# Patient Record
Sex: Female | Born: 1985 | Race: Black or African American | Hispanic: No | Marital: Single | Smoking: Current every day smoker
Health system: Southern US, Community
[De-identification: ages and names within clinical notes are randomized; demographics above are authoritative.]

## PROBLEM LIST (undated history)

## (undated) DIAGNOSIS — K649 Unspecified hemorrhoids: Secondary | ICD-10-CM

## (undated) DIAGNOSIS — I10 Essential (primary) hypertension: Secondary | ICD-10-CM

## (undated) DIAGNOSIS — D649 Anemia, unspecified: Secondary | ICD-10-CM

## (undated) HISTORY — DX: Unspecified hemorrhoids: K64.9

## (undated) HISTORY — DX: Anemia, unspecified: D64.9

---
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
## 2007-04-27 ENCOUNTER — Ambulatory Visit: Payer: Self-pay | Admitting: Family Medicine

## 2007-09-15 ENCOUNTER — Observation Stay: Payer: Self-pay

## 2007-09-20 ENCOUNTER — Observation Stay: Payer: Self-pay | Admitting: Obstetrics and Gynecology

## 2007-09-21 ENCOUNTER — Observation Stay: Payer: Self-pay | Admitting: Obstetrics and Gynecology

## 2007-09-29 ENCOUNTER — Observation Stay: Payer: Self-pay | Admitting: Obstetrics and Gynecology

## 2007-10-05 ENCOUNTER — Inpatient Hospital Stay: Payer: Self-pay | Admitting: Obstetrics and Gynecology

## 2008-05-09 ENCOUNTER — Observation Stay: Payer: Self-pay | Admitting: Certified Nurse Midwife

## 2008-06-22 ENCOUNTER — Observation Stay: Payer: Self-pay | Admitting: Unknown Physician Specialty

## 2008-09-24 ENCOUNTER — Ambulatory Visit: Payer: Self-pay

## 2008-09-24 ENCOUNTER — Inpatient Hospital Stay: Payer: Self-pay

## 2010-04-24 LAB — HM HIV SCREENING LAB: HM HIV Screening: NEGATIVE

## 2010-10-15 ENCOUNTER — Emergency Department: Payer: Self-pay | Admitting: Emergency Medicine

## 2011-10-27 ENCOUNTER — Emergency Department: Payer: Self-pay | Admitting: Emergency Medicine

## 2011-10-27 LAB — COMPREHENSIVE METABOLIC PANEL
Albumin: 4.5 g/dL (ref 3.4–5.0)
Alkaline Phosphatase: 73 U/L (ref 50–136)
Anion Gap: 6 — ABNORMAL LOW (ref 7–16)
Chloride: 105 mmol/L (ref 98–107)
Co2: 26 mmol/L (ref 21–32)
Creatinine: 0.77 mg/dL (ref 0.60–1.30)
EGFR (Non-African Amer.): >60
Glucose: 82 mg/dL (ref 65–99)
Osmolality: 274 (ref 275–301)
SGPT (ALT): 22 U/L
Total Protein: 9.5 g/dL — ABNORMAL HIGH (ref 6.4–8.2)

## 2011-10-27 LAB — CBC
MCHC: 32.2 g/dL (ref 32.0–36.0)
Platelet: 249 10*3/uL (ref 150–440)
RDW: 15.6 % — ABNORMAL HIGH (ref 11.5–14.5)

## 2011-10-27 LAB — URINALYSIS, COMPLETE
Bilirubin,UR: NEGATIVE
Glucose,UR: NEGATIVE mg/dL (ref 0–75)
Nitrite: POSITIVE
Ph: 5 (ref 4.5–8.0)
Squamous Epithelial: 7
WBC UR: 34 /HPF (ref 0–5)

## 2013-09-11 ENCOUNTER — Emergency Department: Payer: Self-pay | Admitting: Emergency Medicine

## 2014-01-30 ENCOUNTER — Emergency Department: Payer: Self-pay | Admitting: Emergency Medicine

## 2014-01-30 LAB — URINALYSIS, COMPLETE
BILIRUBIN, UR: NEGATIVE
Glucose,UR: NEGATIVE mg/dL (ref 0–75)
Ketone: NEGATIVE
Nitrite: NEGATIVE
Ph: 6 (ref 4.5–8.0)
SPECIFIC GRAVITY: 1.026 (ref 1.003–1.030)
Squamous Epithelial: 6
WBC UR: 67 /HPF (ref 0–5)

## 2014-04-05 ENCOUNTER — Emergency Department: Payer: Self-pay | Admitting: Emergency Medicine

## 2014-04-05 LAB — URINALYSIS, COMPLETE
BILIRUBIN, UR: NEGATIVE
Blood: NEGATIVE
GLUCOSE, UR: NEGATIVE mg/dL (ref 0–75)
Nitrite: POSITIVE
Ph: 5 (ref 4.5–8.0)
Protein: 30
RBC,UR: NONE SEEN /HPF (ref 0–5)
SPECIFIC GRAVITY: 1.023 (ref 1.003–1.030)
Squamous Epithelial: NONE SEEN
WBC UR: 110 /HPF (ref 0–5)

## 2014-07-24 ENCOUNTER — Emergency Department: Payer: Self-pay | Admitting: Emergency Medicine

## 2014-11-17 ENCOUNTER — Emergency Department: Payer: Self-pay | Admitting: Emergency Medicine

## 2015-05-17 ENCOUNTER — Encounter: Payer: Self-pay | Admitting: Emergency Medicine

## 2015-05-17 ENCOUNTER — Emergency Department
Admission: EM | Admit: 2015-05-17 | Discharge: 2015-05-17 | Disposition: A | Discharge status: Home / Self Care | Payer: Medicaid Other | Attending: Emergency Medicine | Admitting: Emergency Medicine

## 2015-05-17 DIAGNOSIS — J020 Streptococcal pharyngitis: Secondary | ICD-10-CM | POA: Diagnosis not present

## 2015-05-17 DIAGNOSIS — R5081 Fever presenting with conditions classified elsewhere: Secondary | ICD-10-CM | POA: Diagnosis not present

## 2015-05-17 DIAGNOSIS — R509 Fever, unspecified: Secondary | ICD-10-CM

## 2015-05-17 DIAGNOSIS — R000 Tachycardia, unspecified: Secondary | ICD-10-CM | POA: Diagnosis not present

## 2015-05-17 DIAGNOSIS — R52 Pain, unspecified: Secondary | ICD-10-CM | POA: Diagnosis present

## 2015-05-17 MED ORDER — ACETAMINOPHEN 500 MG PO TABS
ORAL_TABLET | ORAL | Status: AC
  Administered 2015-05-17: 1000 mg via ORAL
  Filled 2015-05-17: qty 2

## 2015-05-17 MED ORDER — ACETAMINOPHEN 500 MG PO TABS
1000.0000 mg | ORAL_TABLET | Freq: Once | ORAL | Status: AC
  Administered 2015-05-17: 1000 mg via ORAL

## 2015-05-17 MED ORDER — PENICILLIN G BENZATHINE 1200000 UNIT/2ML IM SUSP
1.2000 10*6.[IU] | Freq: Once | INTRAMUSCULAR | Status: AC
  Administered 2015-05-17: 1.2 10*6.[IU] via INTRAMUSCULAR
  Filled 2015-05-17: qty 2

## 2015-05-17 MED ORDER — DEXAMETHASONE SODIUM PHOSPHATE 10 MG/ML IJ SOLN
10.0000 mg | Freq: Once | INTRAMUSCULAR | Status: AC
  Administered 2015-05-17: 10 mg via INTRAMUSCULAR
  Filled 2015-05-17: qty 1

## 2015-05-17 MED ORDER — IBUPROFEN 800 MG PO TABS: 800.0000 mg | ORAL_TABLET | Freq: Three times a day (TID) | ORAL | Status: DC | PRN

## 2015-05-17 NOTE — Discharge Instructions (Signed)
Fever, Adult A fever is a higher than normal body temperature. In an adult, an oral temperature around 98.6 F (37 C) is considered normal. A temperature of 100.4 F (38 C) or higher is generally considered a fever. Mild or moderate fevers generally have no long-term effects and often do not require treatment. Extreme fever (greater than or equal to 106 F or 41.1 C) can cause seizures. The sweating that may occur with repeated or prolonged fever may cause dehydration. Elderly people can develop confusion during a fever. A measured temperature can vary with:  Age.  Time of day.  Method of measurement (mouth, underarm, rectal, or ear). The fever is confirmed by taking a temperature with a thermometer. Temperatures can be taken different ways. Some methods are accurate and some are not.  An oral temperature is used most commonly. Electronic thermometers are fast and accurate.  An ear temperature will only be accurate if the thermometer is positioned as recommended by the manufacturer.  A rectal temperature is accurate and done for those adults who have a condition where an oral temperature cannot be taken.  An underarm (axillary) temperature is not accurate and not recommended. Fever is a symptom, not a disease.  CAUSES   Infections commonly cause fever.  Some noninfectious causes for fever include:  Some arthritis conditions.  Some thyroid or adrenal gland conditions.  Some immune system conditions.  Some types of cancer.  A medicine reaction.  High doses of certain street drugs such as methamphetamine.  Dehydration.  Exposure to high outside or room temperatures.  Occasionally, the source of a fever cannot be determined. This is sometimes called a "fever of unknown origin" (FUO).  Some situations may lead to a temporary rise in body temperature that may go away on its own. Examples are:  Childbirth.  Surgery.  Intense exercise. HOME CARE INSTRUCTIONS   Take  appropriate medicines for fever. Follow dosing instructions carefully. If you use acetaminophen to reduce the fever, be careful to avoid taking other medicines that also contain acetaminophen. Do not take aspirin for a fever if you are younger than age 75. There is an association with Reye's syndrome. Reye's syndrome is a rare but potentially deadly disease.  If an infection is present and antibiotics have been prescribed, take them as directed. Finish them even if you start to feel better.  Rest as needed.  Maintain an adequate fluid intake. To prevent dehydration during an illness with prolonged or recurrent fever, you may need to drink extra fluid.Drink enough fluids to keep your urine clear or pale yellow.  Sponging or bathing with room temperature water may help reduce body temperature. Do not use ice water or alcohol sponge baths.  Dress comfortably, but do not over-bundle. SEEK MEDICAL CARE IF:   You are unable to keep fluids down.  You develop vomiting or diarrhea.  You are not feeling at least partly better after 3 days.  You develop new symptoms or problems. SEEK IMMEDIATE MEDICAL CARE IF:   You have shortness of breath or trouble breathing.  You develop excessive weakness.  You are dizzy or you faint.  You are extremely thirsty or you are making little or no urine.  You develop new pain that was not there before (such as in the head, neck, chest, back, or abdomen).  You have persistent vomiting and diarrhea for more than 1 to 2 days.  You develop a stiff neck or your eyes become sensitive to light.  You develop a  skin rash.  You have a fever or persistent symptoms for more than 2 to 3 days.  You have a fever and your symptoms suddenly get worse. MAKE SURE YOU:   Understand these instructions.  Will watch your condition.  Will get help right away if you are not doing well or get worse. Document Released: 02/10/2001 Document Revised: 01/01/2014 Document  Reviewed: 06/18/2011 [facility] Patient Information 2015 [place], [place]. This information is not intended to replace advice given to you by your health care provider. Make sure you discuss any questions you have with your health care provider. Strep Throat Strep throat is an infection of the throat caused by a bacteria named Streptococcus pyogenes. Your health care provider may call the infection streptococcal "tonsillitis" or "pharyngitis" depending on whether there are signs of inflammation in the tonsils or back of the throat. Strep throat is most common in children aged 5-15 years during the cold months of the year, but it can occur in people of any age during any season. This infection is spread from person to person (contagious) through coughing, sneezing, or other close contact. SIGNS AND SYMPTOMS   Fever or chills.  Painful, swollen, red tonsils or throat.  Pain or difficulty when swallowing.  White or yellow spots on the tonsils or throat.  Swollen, tender lymph nodes or "glands" of the neck or under the jaw.  Red rash all over the body (rare). DIAGNOSIS  Many different infections can cause the same symptoms. A test must be done to confirm the diagnosis so the right treatment can be given. A "rapid strep test" can help your health care provider make the diagnosis in a few minutes. If this test is not available, a light swab of the infected area can be used for a throat culture test. If a throat culture test is done, results are usually available in a day or two. TREATMENT  Strep throat is treated with antibiotic medicine. HOME CARE INSTRUCTIONS   Gargle with 1 tsp of salt in 1 cup of warm water, 3-4 times per day or as needed for comfort.  Family members who also have a sore throat or fever should be tested for strep throat and treated with antibiotics if they have the strep infection.  Make sure everyone in your household washes their hands well.  Do not share food, drinking  cups, or personal items that could cause the infection to spread to others.  You may need to eat a soft food diet until your sore throat gets better.  Drink enough water and fluids to keep your urine clear or pale yellow. This will help prevent dehydration.  Get plenty of rest.  Stay home from school, day care, or work until you have been on antibiotics for 24 hours.  Take medicines only as directed by your health care provider.  Take your antibiotic medicine as directed by your health care provider. Finish it even if you start to feel better. SEEK MEDICAL CARE IF:   The glands in your neck continue to enlarge.  You develop a rash, cough, or earache.  You cough up green, yellow-brown, or bloody sputum.  You have pain or discomfort not controlled by medicines.  Your problems seem to be getting worse rather than better.  You have a fever. SEEK IMMEDIATE MEDICAL CARE IF:   You develop any new symptoms such as vomiting, severe headache, stiff or painful neck, chest pain, shortness of breath, or trouble swallowing.  You develop severe throat pain, drooling,  or changes in your voice.  You develop swelling of the neck, or the skin on the neck becomes red and tender.  You develop signs of dehydration, such as fatigue, dry mouth, and decreased urination.  You become increasingly sleepy, or you cannot wake up completely. MAKE SURE YOU:  Understand these instructions.  Will watch your condition.  Will get help right away if you are not doing well or get worse. Document Released: 08/14/2000 Document Revised: 01/01/2014 Document Reviewed: 10/16/2010 [facility] Patient Information 2015 [place], [place]. This information is not intended to replace advice given to you by your health care provider. Make sure you discuss any questions you have with your health care provider.

## 2015-05-17 NOTE — ED Notes (Signed)
Pt states she has body aches, sore throat, pt states she hasnt been able to get out of bed due to being so weak

## 2015-05-17 NOTE — ED Provider Notes (Signed)
CSN: [id number]     Arrival date & time 05/17/15  1751 History   First MD Initiated Contact with Patient 05/17/15 1941     Chief Complaint  Patient presents with  . Generalized Body Aches     (Consider location/radiation/quality/duration/timing/severity/associated sxs/prior Treatment) HPI  Andrea Huang is a 29 y.o. female presenting with a 3 day history of sore throat and myalgias. Patient states that she has 10/10 pain described as a constant soreness throughout her entire body, but worse in her legs. She states it is painful to swallow and that she has not eaten anything for 3 days and has limited fluid intake as well. Sore throat is unrelieved by cough drops. Patient has tried no OTC pain relievers or fever reducers at home.Patient has not checked temperature at home, but has felt hot, cold, and sweaty.  She denies nasal congestion, cough, SOB, chest pain, or LOC.   She denies recent sick contacts or recent international travel. Patient has history of multiple strep throat infections and states that these symptoms seem similar to when she has had strep throat in the past.   History reviewed. No pertinent past medical history. History reviewed. No pertinent past surgical history. Family History  Problem Relation Age of Onset  . Family history unknown: Yes   Social History  Substance Use Topics  . Smoking status: Never Smoker   . Smokeless tobacco: Never Used  . Alcohol Use: No   OB History    No data available     Review of Systems  Constitutional: Positive for chills, diaphoresis, appetite change (decreased) and fatigue.  HENT: Positive for ear pain (left side with headache). Negative for congestion, drooling, rhinorrhea, sinus pressure and sneezing.   Eyes: Positive for visual disturbance (blurry vision when dizzy).  Respiratory: Negative for cough, chest tightness and shortness of breath.   Cardiovascular: Negative for chest pain and leg swelling.  Gastrointestinal:  Positive for nausea. Negative for vomiting, abdominal pain, diarrhea, constipation and blood in stool.  Genitourinary: Positive for decreased urine volume. Negative for dysuria, frequency, hematuria, vaginal bleeding and difficulty urinating.  Musculoskeletal: Positive for myalgias. Negative for neck stiffness.  Skin: Negative for rash.  Neurological: Positive for headaches. Negative for syncope.      Allergies  Review of patient's allergies indicates no known allergies.  Home Medications   Prior to Admission medications   Medication Sig Start Date End Date Taking? Authorizing Provider  ibuprofen (ADVIL,MOTRIN) 800 MG tablet Take 1 tablet (800 mg total) by mouth every 8 (eight) hours as needed. 05/17/15   Evon Slack, PA-C   BP 119/82 mmHg  Pulse 102  Temp(Src) 99 F (37.2 C) (Oral)  Resp 18  Ht  (1.575 m)  Wt 115 lb (52.164 kg)  BMI 21.03 kg/m2  SpO2 99%  LMP 05/07/2015 Physical Exam  Constitutional: She is oriented to person, place, and time. She appears well-developed. She appears lethargic. She appears distressed.  HENT:  Head: Normocephalic and atraumatic.  Right Ear: External ear normal.  Left Ear: External ear normal.  Mouth/Throat: Uvula is midline. Oropharyngeal exudate and posterior oropharyngeal erythema present. No tonsillar abscesses.  No uvula shift  Eyes: Conjunctivae and EOM are normal.  Neck: Normal range of motion. Neck supple.  Cardiovascular: Regular rhythm.  Tachycardia present.  Exam reveals no gallop and no friction rub.   No murmur heard. Pulmonary/Chest: Effort normal and breath sounds normal. No stridor. She has no wheezes. She has no rales.  Abdominal: Soft.  Bowel sounds are normal. She exhibits no distension and no mass. There is tenderness (mildly tender throughout abdomen). There is no rebound and no guarding.  Musculoskeletal: Normal range of motion. She exhibits tenderness (upper thighs tender with movement). She exhibits no edema.   Lymphadenopathy:    She has cervical adenopathy.  Neurological: She is oriented to person, place, and time. She appears lethargic. No cranial nerve deficit or sensory deficit.  Skin: Skin is warm and dry. No rash noted. She is not diaphoretic.    ED Course  Procedures (including critical care time) Labs Review Labs Reviewed - No data to display  Imaging Review No results found. I have personally reviewed and evaluated these images and lab results as part of my medical decision-making.   EKG Interpretation None     Rapid strep is positive. Temperature is 102.6 F and pulse is 133 on arrival. Tylenol 1,000 mg po given. Temperature on recheck 102.6F and pulse 122. At time of discharge temperature 90.9, pulse 102, respirations 18, O2 99%. Tolerating by mouth well  Encourage po fluid intake with water.    Decadron 10 mg IM and Penicillin G 1.2 million units IM given.  MDM   Final diagnoses:  Strep pharyngitis  Other specified fever   29 year old female with strep throat. Treated with penicillin G 1.2 million units IM, dexamethasone 10 mg IM. Ibuprofen, Tylenol as needed for fevers. Increase fluids. Follow 2-3 days if no improvement.    Evon Slack, PA-C 05/17/15 2143  Minna Antis, MD 05/17/15 [phone]

## 2015-05-21 LAB — POCT RAPID STREP A: STREPTOCOCCUS, GROUP A SCREEN (DIRECT): POSITIVE — AB

## 2015-05-23 ENCOUNTER — Encounter: Payer: Self-pay | Admitting: *Deleted

## 2015-05-23 ENCOUNTER — Emergency Department
Admission: EM | Admit: 2015-05-23 | Discharge: 2015-05-23 | Disposition: A | Discharge status: Home / Self Care | Payer: Medicaid Other | Attending: Emergency Medicine | Admitting: Emergency Medicine

## 2015-05-23 DIAGNOSIS — J020 Streptococcal pharyngitis: Secondary | ICD-10-CM | POA: Insufficient documentation

## 2015-05-23 DIAGNOSIS — J029 Acute pharyngitis, unspecified: Secondary | ICD-10-CM | POA: Diagnosis present

## 2015-05-23 LAB — CBC WITH DIFFERENTIAL/PLATELET
BASOS ABS: 0 10*3/uL (ref 0–0.1)
Eosinophils Absolute: 0.1 10*3/uL (ref 0–0.7)
Eosinophils Relative: 1 %
HEMATOCRIT: 32.7 % — AB (ref 35.0–47.0)
HEMOGLOBIN: 10.3 g/dL — AB (ref 12.0–16.0)
Lymphs Abs: 1 10*3/uL (ref 1.0–3.6)
MCH: 23 pg — ABNORMAL LOW (ref 26.0–34.0)
MCHC: 31.5 g/dL — ABNORMAL LOW (ref 32.0–36.0)
MCV: 73 fL — AB (ref 80.0–100.0)
Monocytes Absolute: 1.6 10*3/uL — ABNORMAL HIGH (ref 0.2–0.9)
Monocytes Relative: 26 %
NEUTROS ABS: 3.4 10*3/uL (ref 1.4–6.5)
Platelets: 227 10*3/uL (ref 150–440)
RBC: 4.48 MIL/uL (ref 3.80–5.20)
RDW: 15.7 % — ABNORMAL HIGH (ref 11.5–14.5)
WBC: 6.1 10*3/uL (ref 3.6–11.0)

## 2015-05-23 LAB — BASIC METABOLIC PANEL
ANION GAP: 10 (ref 5–15)
BUN: 9 mg/dL (ref 6–20)
CALCIUM: 9.4 mg/dL (ref 8.9–10.3)
CO2: 27 mmol/L (ref 22–32)
Chloride: 95 mmol/L — ABNORMAL LOW (ref 101–111)
Creatinine, Ser: 0.59 mg/dL (ref 0.44–1.00)
GLUCOSE: 97 mg/dL (ref 65–99)
POTASSIUM: 3.9 mmol/L (ref 3.5–5.1)
Sodium: 132 mmol/L — ABNORMAL LOW (ref 135–145)

## 2015-05-23 LAB — POCT RAPID STREP A: STREPTOCOCCUS, GROUP A SCREEN (DIRECT): NEGATIVE

## 2015-05-23 LAB — MONONUCLEOSIS SCREEN: Mono Screen: NEGATIVE

## 2015-05-23 MED ORDER — MAGIC MOUTHWASH: 5.0000 mL | Freq: Four times a day (QID) | ORAL | Status: DC

## 2015-05-23 MED ORDER — AZITHROMYCIN 250 MG PO TABS: ORAL_TABLET | ORAL | Status: DC

## 2015-05-23 NOTE — ED Notes (Signed)
States she was seen last week for strep  Now having body aches

## 2015-05-23 NOTE — ED Provider Notes (Signed)
[facility] Emergency Department Provider Note  ____________________________________________  Time seen: Approximately 1:42 PM  I have reviewed the triage vital signs and the nursing notes.   HISTORY  Chief Complaint Sore Throat and Generalized Body Aches   HPI Andrea Huang is a 29 y.o. female is here with complaint of sore throat. Patient states she was seen here last week for strep throat. She complains of body aches today. She states that she was given an injection of antibiotic's last week.She rates her pain at present is a 10 out of 10. Patient is drinking fluids and is urinating on a regular basis. He states he is eating less due to her throat pain.   History reviewed. No pertinent past medical history.  There are no active problems to display for this patient.   History reviewed. No pertinent past surgical history.  Current Outpatient Rx  Name  Route  Sig  Dispense  Refill  . azithromycin (ZITHROMAX Z-PAK) 250 MG tablet      Take 2 tablets (500 mg) on  Day 1,  followed by 1 tablet (250 mg) once daily on Days 2 through 5.   6 each   0   . ibuprofen (ADVIL,MOTRIN) 800 MG tablet   Oral   Take 1 tablet (800 mg total) by mouth every 8 (eight) hours as needed.   30 tablet   0   . magic mouthwash SOLN   Oral   Take 5 mLs by mouth 4 (four) times daily.   100 mL   0     Allergies Review of patient's allergies indicates no known allergies.  Family History  Problem Relation Age of Onset  . Family history unknown: Yes    Social History Social History  Substance Use Topics  . Smoking status: Never Smoker   . Smokeless tobacco: Never Used  . Alcohol Use: No    Review of Systems Constitutional: Positive fever/chills Eyes: No visual changes. ENT: Positive sore throat. Cardiovascular: Denies chest pain. Respiratory: Denies shortness of breath. Gastrointestinal: No abdominal pain.  No nausea, no vomiting.  No diarrhea.  No  constipation. Genitourinary: Negative for dysuria. Musculoskeletal: Negative for back pain. Positive for general body aches Skin: Negative for rash. Neurological: Negative for headaches, focal weakness or numbness.  10-point ROS otherwise negative.  ____________________________________________   PHYSICAL EXAM:  VITAL SIGNS: ED Triage Vitals  Enc Vitals Group     BP 05/23/15 1155 129/86 mmHg     Pulse Rate 05/23/15 1155 115     Resp 05/23/15 1155 16     Temp 05/23/15 1155 99.1 F (37.3 C)     Temp Source 05/23/15 1155 Oral     SpO2 05/23/15 1155 97 %     Weight --      Height --      Head Cir --      Peak Flow --      Pain Score 05/23/15 1154 10     Pain Loc --      Pain Edu? --      Excl. in GC? --     Constitutional: Alert and oriented. Well appearing and in no acute distress. Eyes: Conjunctivae are normal. PERRL. EOMI. Head: Atraumatic. Nose: No congestion/rhinnorhea. Mouth/Throat: Mucous membranes are moist.  Oropharynx mild erythema with irritation and small vesicles with a mixture of white exudate on the soft palate bilaterally. Patient is able to swallow without any difficulty. She is talking in full complete sentences. Neck: No stridor.  Hematological/Lymphatic/Immunilogical: Moderate tenderness bilateral cervical lymphadenopathy. Cardiovascular: Normal rate, regular rhythm. Grossly normal heart sounds.  Good peripheral circulation. Respiratory: Normal respiratory effort.  No retractions. Lungs CTAB. Gastrointestinal: Soft and nontender. No distention.  Musculoskeletal: No lower extremity tenderness nor edema.  No joint effusions. Neurologic:  Normal speech and language. No gross focal neurologic deficits are appreciated. No gait instability. Skin:  Skin is warm, dry and intact. No rash noted. Psychiatric: Mood and affect are normal. Speech and behavior are normal.  ____________________________________________   LABS (all labs ordered are listed, but only  abnormal results are displayed)  Labs Reviewed  BASIC METABOLIC PANEL - Abnormal; Notable for the following:    Sodium 132 (*)    Chloride 95 (*)    All other components within normal limits  CBC WITH DIFFERENTIAL/PLATELET - Abnormal; Notable for the following:    Hemoglobin 10.3 (*)    HCT 32.7 (*)    MCV 73.0 (*)    MCH 23.0 (*)    MCHC 31.5 (*)    RDW 15.7 (*)    Monocytes Absolute 1.6 (*)    All other components within normal limits  MONONUCLEOSIS SCREEN  POCT RAPID STREP A   PROCEDURES  Procedure(s) performed: None  Critical Care performed: No  ____________________________________________   INITIAL IMPRESSION / ASSESSMENT AND PLAN / ED COURSE  Pertinent labs & imaging results that were available during my care of the patient were reviewed by me and considered in my medical decision making (see chart for details).  Patient was given a prescription for Z-Pak along with Magic mouthwash. She is to follow-up with Dr. Jenne Campus if any continued throat problems. She was also given a note to stay out of work today and tomorrow. CBC was normal and monos negative ____________________________________________   FINAL CLINICAL IMPRESSION(S) / ED DIAGNOSES  Final diagnoses:  Acute streptococcal pharyngitis      Tommi Rumps, PA-C 05/23/15 1544  Emily Filbert, MD 05/27/15 [phone]

## 2015-05-23 NOTE — Discharge Instructions (Signed)

## 2016-07-13 ENCOUNTER — Emergency Department
Admission: EM | Admit: 2016-07-13 | Discharge: 2016-07-13 | Disposition: A | Discharge status: Home / Self Care | Payer: Medicaid Other | Attending: Emergency Medicine | Admitting: Emergency Medicine

## 2016-07-13 ENCOUNTER — Encounter: Payer: Self-pay | Admitting: Emergency Medicine

## 2016-07-13 ENCOUNTER — Emergency Department: Payer: Medicaid Other

## 2016-07-13 DIAGNOSIS — R05 Cough: Secondary | ICD-10-CM | POA: Diagnosis present

## 2016-07-13 DIAGNOSIS — J40 Bronchitis, not specified as acute or chronic: Secondary | ICD-10-CM | POA: Diagnosis not present

## 2016-07-13 MED ORDER — IPRATROPIUM-ALBUTEROL 0.5-2.5 (3) MG/3ML IN SOLN
3.0000 mL | Freq: Once | RESPIRATORY_TRACT | Status: AC
  Administered 2016-07-13: 3 mL via RESPIRATORY_TRACT
  Filled 2016-07-13: qty 3

## 2016-07-13 MED ORDER — AZITHROMYCIN 250 MG PO TABS
ORAL_TABLET | ORAL | 0 refills | Status: AC
Start: 2016-07-13 — End: 2016-07-18

## 2016-07-13 MED ORDER — ALBUTEROL SULFATE HFA 108 (90 BASE) MCG/ACT IN AERS: 2.0000 | INHALATION_SPRAY | Freq: Four times a day (QID) | RESPIRATORY_TRACT | 2 refills | Status: DC | PRN

## 2016-07-13 NOTE — ED Notes (Signed)
States she developed sore throat with cough about 2 days ago  Unsure of fever   states

## 2016-07-13 NOTE — ED Notes (Signed)
Sore throat and cough for 2 days   States cough is non prod   Unsure of fever

## 2016-07-13 NOTE — ED Triage Notes (Signed)
C/o cough and sore throat.  No resp distress

## 2016-07-13 NOTE — ED Provider Notes (Signed)
[facility] Emergency Department Provider Note  ____________________________________________   First MD Initiated Contact with Patient 07/13/16 [phone]     (approximate)  I have reviewed the triage vital signs and the nursing notes.   HISTORY  Chief Complaint Sore Throat    HPI Andrea Huang is a 30 y.o. female presenting with cough productive for purulent sputum for the past 2 days. She describes chest tightness at night with fatigue, malaise and some shortness of breath. She is a smoker. She denies sick contacts and recent travel. She is unsure about fever as she has not evaluated her temperature. She denies abdominal pain, diarrhea, constipation, nausea, and vomiting. She has not attempted to relieve her symptoms. Symptoms are worse at night when patient is supine.  History reviewed. No pertinent past medical history.  There are no active problems to display for this patient.   History reviewed. No pertinent surgical history.  Prior to Admission medications   Medication Sig Start Date End Date Taking? Authorizing Provider  albuterol (PROVENTIL HFA;VENTOLIN HFA) 108 (90 Base) MCG/ACT inhaler Inhale 2 puffs into the lungs every 6 (six) hours as needed for wheezing or shortness of breath. 07/13/16   Orvil FeilJaclyn M Cicely Ortner, PA-C  azithromycin (ZITHROMAX Z-PAK) 250 MG tablet Take 2 tablets (500 mg) on  Day 1,  followed by 1 tablet (250 mg) once daily on Days 2 through 5. 07/13/16 07/18/16  Orvil FeilJaclyn M Breonia Kirstein, PA-C  ibuprofen (ADVIL,MOTRIN) 800 MG tablet Take 1 tablet (800 mg total) by mouth every 8 (eight) hours as needed. 05/17/15   Evon Slackhomas C Gaines, PA-C  magic mouthwash SOLN Take 5 mLs by mouth 4 (four) times daily. 05/23/15   Tommi Rumpshonda L Summers, PA-C    Allergies Patient has no known allergies.  Family History  Problem Relation Age of Onset  . Family history unknown: Yes    Social History Social History  Substance Use Topics  . Smoking status: Never Smoker    . Smokeless tobacco: Never Used  . Alcohol use No    Review of Systems  Constitutional: No chills. Has fatigue and malaise. Eyes: No visual changes. ENT: No sore throat. Cardiovascular: Has chest tightness. Respiratory: Intermittent shortness of breath and purulent sputum production.  Gastrointestinal: No abdominal pain.  No nausea, no vomiting.  No diarrhea.  No constipation. Genitourinary: Negative for dysuria. Musculoskeletal: Negative for back pain. Skin: Negative for rash. Neurological: Negative for headaches, focal weakness or numbness.  10-point ROS otherwise negative.  ____________________________________________   PHYSICAL EXAM:  VITAL SIGNS: ED Triage Vitals  Enc Vitals Group     BP 07/13/16 0735 (!) 146/90     Pulse Rate 07/13/16 0735 (!) 112     Resp 07/13/16 0735 18     Temp 07/13/16 0735 98.9 F (37.2 C)     Temp Source 07/13/16 0735 Oral     SpO2 07/13/16 0735 100 %     Weight 07/13/16 0731 120 lb (54.4 kg)     Height 07/13/16 0731 5\' 4"  (1.626 m)     Head Circumference --      Peak Flow --      Pain Score 07/13/16 0731 10     Pain Loc --      Pain Edu? --      Excl. in GC? --    Constitutional: Alert and oriented. Well appearing and in no acute distress. Eyes: Conjunctivae are normal. PERRL. EOMI. Head: Atraumatic. Nose: Nasal turbinates are erythematous. Mouth/Throat: Mucous membranes are  moist. No erythema, petechiae or exudate. Neck: Full range of motion.  Immunilogical: No cervical lymphadenopathy. Cardiovascular: Normal rate, regular rhythm. Grossly normal heart sounds.  Good peripheral circulation. Respiratory: Normal respiratory effort.  No retractions. Inspiratory stridor auscultated Gastrointestinal: Soft and nontender. No distention. No abdominal bruits. No CVA tenderness. Musculoskeletal: No lower extremity tenderness nor edema.  No joint effusions. Neurologic:  Normal speech and language. No gross focal neurologic deficits are  appreciated. No gait instability. Skin:  Skin is warm, dry and intact. No rash noted. Psychiatric: Mood and affect are normal. Speech and behavior are normal.  ____________________________________________   LABS (all labs ordered are listed, but only abnormal results are displayed)  Labs Reviewed - No data to display ____________________________________________   RADIOLOGY  I, Orvil FeilJaclyn M Anjannette Gauger, personally viewed and evaluated these images (plain radiographs) as part of my medical decision making, as well as reviewing the written report by the radiologist.  Chest X Ray: No consolidations visualized.  ____________________________________________   PROCEDURES  Procedure(s) performed:   Procedures: None     ____________________________________________   INITIAL IMPRESSION / ASSESSMENT AND PLAN / ED COURSE  Pertinent labs & imaging results that were available during my care of the patient were reviewed by me and considered in my medical decision making (see chart for details).  Acute Bronchitis:   Differential diagnosis includes acute bronchitis and pneumonia. Patient's chest x-ray did not reveal consolidations consistent with pneumonia.   Assessment: Because patient's symptoms include shortness of breath, malaise, and purulent sputum production, patient was treated with azithromycin. A DUONEB was also given in the emergency department. Patient was prescribed an albuterol inhaler to be used as needed for shortness of breath. She was advised to rest and stay hydrated. All patient questions were answered.  Clinical Course      ____________________________________________   FINAL CLINICAL IMPRESSION(S) / ED DIAGNOSES  Final diagnoses:  Bronchitis      NEW MEDICATIONS STARTED DURING THIS VISIT:  Discharge Medication List as of 07/13/2016  8:49 AM    START taking these medications   Details  albuterol (PROVENTIL HFA;VENTOLIN HFA) 108 (90 Base) MCG/ACT inhaler  Inhale 2 puffs into the lungs every 6 (six) hours as needed for wheezing or shortness of breath., Starting Mon 07/13/2016, Print         Note:  This document was prepared using Dragon voice recognition software and may include unintentional dictation errors.    Orvil FeilJaclyn M Renada Cronin, PA-C 07/13/16 [id number]    Arnaldo NatalPaul F Malinda, MD 07/13/16 [phone]

## 2016-08-04 ENCOUNTER — Emergency Department
Admission: EM | Admit: 2016-08-04 | Discharge: 2016-08-04 | Disposition: A | Discharge status: Home / Self Care | Payer: Medicaid Other | Attending: Emergency Medicine | Admitting: Emergency Medicine

## 2016-08-04 ENCOUNTER — Encounter: Payer: Self-pay | Admitting: Emergency Medicine

## 2016-08-04 DIAGNOSIS — K641 Second degree hemorrhoids: Secondary | ICD-10-CM

## 2016-08-04 DIAGNOSIS — K6289 Other specified diseases of anus and rectum: Secondary | ICD-10-CM | POA: Diagnosis present

## 2016-08-04 MED ORDER — PRAMOXINE HCL 1 % RE FOAM: 1.0000 "application " | Freq: Three times a day (TID) | RECTAL | 1 refills | Status: DC | PRN

## 2016-08-04 MED ORDER — LIDOCAINE HCL 2 % EX GEL
CUTANEOUS | Status: AC
  Filled 2016-08-04: qty 10

## 2016-08-04 MED ORDER — SENNOSIDES-DOCUSATE SODIUM 8.6-50 MG PO TABS: 2.0000 | ORAL_TABLET | Freq: Every day | ORAL | 0 refills | Status: DC | PRN

## 2016-08-04 MED ORDER — LIDOCAINE HCL 2 % EX GEL
1.0000 "application " | Freq: Once | CUTANEOUS | Status: DC
  Filled 2016-08-04: qty 5

## 2016-08-04 NOTE — ED Triage Notes (Signed)
Pt to ed with c/o rectal pain and hemorrhoids for several months.  Pt states she has been seen here for same in the past.

## 2016-08-04 NOTE — ED Provider Notes (Signed)
[facility] Emergency Department Provider Note   ____________________________________________   First MD Initiated Contact with Patient 08/04/16 1232     (approximate)  I have reviewed the triage vital signs and the nursing notes.   HISTORY  Chief Complaint Rectal Pain    HPI Andrea PriceDakeshia L Corcoran is a 30 y.o. female patient underwent a rectal pain and protruding hemorrhoid. Patient states she has this condition every fall and winter. Patient denies any bleeding with this complaint. Patient stated no bowel movement secondary to pain with defecation. Patient rates her pain discomfort as a 10 over 10. Patient described a pain as "sharp". No palliative measures taken for this complaint.  History reviewed. No pertinent past medical history.  There are no active problems to display for this patient.   History reviewed. No pertinent surgical history.  Prior to Admission medications   Medication Sig Start Date End Date Taking? Authorizing Provider  albuterol (PROVENTIL HFA;VENTOLIN HFA) 108 (90 Base) MCG/ACT inhaler Inhale 2 puffs into the lungs every 6 (six) hours as needed for wheezing or shortness of breath. 07/13/16   Orvil FeilJaclyn M Woods, PA-C  ibuprofen (ADVIL,MOTRIN) 800 MG tablet Take 1 tablet (800 mg total) by mouth every 8 (eight) hours as needed. 05/17/15   Evon Slackhomas C Gaines, PA-C  magic mouthwash SOLN Take 5 mLs by mouth 4 (four) times daily. 05/23/15   Tommi Rumpshonda L Summers, PA-C  pramoxine (PROCTOFOAM) 1 % foam Place 1 application rectally 3 (three) times daily as needed for hemorrhoids. 08/04/16   Joni Reiningonald K Smith, PA-C  senna-docusate (SENOKOT-S) 8.6-50 MG tablet Take 2 tablets by mouth daily as needed for mild constipation. 08/04/16   Joni Reiningonald K Smith, PA-C    Allergies Patient has no known allergies.  Family History  Problem Relation Age of Onset  . Family history unknown: Yes    Social History Social History  Substance Use Topics  . Smoking status: Never  Smoker  . Smokeless tobacco: Never Used  . Alcohol use No    Review of Systems Constitutional: No fever/chills Eyes: No visual changes. ENT: No sore throat. Cardiovascular: Denies chest pain. Respiratory: Denies shortness of breath. Gastrointestinal: No abdominal pain.  No nausea, no vomiting.  No diarrhea.  No constipation. Rectal hemorrhoid Genitourinary: Negative for dysuria. Musculoskeletal: Negative for back pain. Skin: Negative for rash. Neurological: Negative for headaches, focal weakness or numbness.    ____________________________________________   PHYSICAL EXAM:  VITAL SIGNS: ED Triage Vitals  Enc Vitals Group     BP 08/04/16 1226 (!) 139/93     Pulse Rate 08/04/16 1226 86     Resp 08/04/16 1226 20     Temp 08/04/16 1226 98.7 F (37.1 C)     Temp Source 08/04/16 1226 Oral     SpO2 08/04/16 1226 99 %     Weight 08/04/16 1226 120 lb (54.4 kg)     Height --      Head Circumference --      Peak Flow --      Pain Score 08/04/16 1227 10     Pain Loc --      Pain Edu? --      Excl. in GC? --     Constitutional: Alert and oriented. Well appearing and in no acute distress. Eyes: Conjunctivae are normal. PERRL. EOMI. Head: Atraumatic. Nose: No congestion/rhinnorhea. Mouth/Throat: Mucous membranes are moist.  Oropharynx non-erythematous. Neck: No stridor.  No cervical spine tenderness to palpation. Hematological/Lymphatic/Immunilogical: No cervical lymphadenopathy. Cardiovascular: Normal rate, regular  rhythm. Grossly normal heart sounds.  Good peripheral circulation. Respiratory: Normal respiratory effort.  No retractions. Lungs CTAB. Gastrointestinal: Soft and nontender. No distention. No abdominal bruits. No CVA tenderness. Protruding nonthrombosed hemorrhoid tissue present. Genitourinary:  Musculoskeletal: No lower extremity tenderness nor edema.  No joint effusions. Neurologic:  Normal speech and language. No gross focal neurologic deficits are appreciated.  No gait instability. Skin:  Skin is warm, dry and intact. No rash noted. Psychiatric: Mood and affect are normal. Speech and behavior are normal.  ____________________________________________   LABS (all labs ordered are listed, but only abnormal results are displayed)  Labs Reviewed - No data to display ____________________________________________  EKG   ____________________________________________  RADIOLOGY   ____________________________________________   PROCEDURES  Procedure(s) performed: None  Procedures  Critical Care performed: No  ____________________________________________   INITIAL IMPRESSION / ASSESSMENT AND PLAN / ED COURSE  Pertinent labs & imaging results that were available during my care of the patient were reviewed by me and considered in my medical decision making (see chart for details).  Rectal hemorrhoid. Patient given discharge care instructions. Patient given a prescription for Proctofoam and Peri-Colace. Patient advised to contact surgical clinic to schedule appointment for definitive treatment.  Clinical Course    Status post application topically with lidocaine hemorrhoid tissue was reduced.  ____________________________________________   FINAL CLINICAL IMPRESSION(S) / ED DIAGNOSES  Final diagnoses:  Grade II hemorrhoids      NEW MEDICATIONS STARTED DURING THIS VISIT:  New Prescriptions   PRAMOXINE (PROCTOFOAM) 1 % FOAM    Place 1 application rectally 3 (three) times daily as needed for hemorrhoids.   SENNA-DOCUSATE (SENOKOT-S) 8.6-50 MG TABLET    Take 2 tablets by mouth daily as needed for mild constipation.     Note:  This document was prepared using Dragon voice recognition software and may include unintentional dictation errors.    Joni ReiningRonald K Smith, PA-C 08/04/16 1303    Myrna Blazeravid Matthew Schaevitz, MD 08/05/16 [phone]

## 2016-08-05 ENCOUNTER — Encounter: Payer: Self-pay | Admitting: General Surgery

## 2016-08-05 ENCOUNTER — Ambulatory Visit (INDEPENDENT_AMBULATORY_CARE_PROVIDER_SITE_OTHER): Payer: Medicaid Other | Admitting: General Surgery

## 2016-08-05 VITALS — BP 145/94 | HR 87 | Temp 98.7°F | Ht 65.0 in | Wt 117.0 lb

## 2016-08-05 DIAGNOSIS — K644 Residual hemorrhoidal skin tags: Secondary | ICD-10-CM | POA: Diagnosis not present

## 2016-08-05 MED ORDER — HYDROCODONE-ACETAMINOPHEN 5-325 MG PO TABS: 1.0000 | ORAL_TABLET | Freq: Four times a day (QID) | ORAL | 0 refills | Status: DC | PRN

## 2016-08-05 MED ORDER — PRAMOXINE HCL 1 % RE FOAM: 1.0000 "application " | Freq: Three times a day (TID) | RECTAL | 1 refills | Status: DC | PRN

## 2016-08-05 NOTE — Progress Notes (Signed)
Patient ID: Andrea Huang Anagnos, female   DOB: 1985-12-19, 30 y.o.   MRN: [id number]  CC: Painful hemorrhoid  HPI Andrea Huang Poulton is a 30 y.o. female who presents to clinic today for evaluation of a painful external hemorrhoid. Patient reports that she's had numerous problems with hemorrhoids before the past. Approximately 1 year ago she had this identical problem that had to be lanced by the emergency room. She reports that she's had this hemorrhoid out for at least the last 4 to 5 days and that it has become very painful. She states the pain is sharp and stabbing especially when she sits on it. She was prescribed treatment for by the emergency room which she has not filled or attempted to use yet. She states nobody told her about hydration or fiber usage. She denies any other problems. She denies any fevers, chills, nausea, vomiting, chest pain, shortness of breath. She states she's not had a bowel movement in the last several days because she is afraid to.  HPI  Past Medical History:  Diagnosis Date  . Hemorrhoid     History reviewed. No pertinent surgical history. No prior surgeries other than [place] of an external hemorrhoid.  Family History  Problem Relation Age of Onset  . Family history unknown: Yes    Social History Social History  Substance Use Topics  . Smoking status: Never Smoker  . Smokeless tobacco: Never Used  . Alcohol use No    No Known Allergies  Current Outpatient Prescriptions  Medication Sig Dispense Refill  . pramoxine (PROCTOFOAM) 1 % foam Place 1 application rectally 3 (three) times daily as needed for hemorrhoids. 15 g 1  . HYDROcodone-acetaminophen (NORCO) 5-325 MG tablet Take 1 tablet by mouth every 6 (six) hours as needed for moderate pain. 20 tablet 0   No current facility-administered medications for this visit.      Review of Systems A Multi-point review of systems was asked and was negative except for the findings documented in the  history of present illness  Physical Exam Blood pressure (!) 145/94, pulse 87, temperature 98.7 F (37.1 C), temperature source Oral, height 5\' 5"  (1.651 m), weight 53.1 kg (117 lb), last menstrual period 07/23/2016. CONSTITUTIONAL: No acute distress. EYES: Pupils are equal, round, and reactive to light, Sclera are non-icteric. EARS, NOSE, MOUTH AND THROAT: The oropharynx is clear. The oral mucosa is pink and moist. Hearing is intact to voice. LYMPH NODES:  Lymph nodes in the neck are normal. RESPIRATORY:  Lungs are clear. There is normal respiratory effort, with equal breath sounds bilaterally, and without pathologic use of accessory muscles. CARDIOVASCULAR: Heart is regular without murmurs, gallops, or rubs. GI: The abdomen is soft, nontender, and nondistended. There are no palpable masses. There is no hepatosplenomegaly. There are normal bowel sounds in all quadrants. GU: Rectal exam shows a tender external hemorrhoid. There is no evidence of internal hemorrhoids present or active bleeding or stool.   MUSCULOSKELETAL: Normal muscle strength and tone. No cyanosis or edema.   SKIN: Turgor is good and there are no pathologic skin lesions or ulcers. NEUROLOGIC: Motor and sensation is grossly normal. Cranial nerves are grossly intact. PSYCH:  Oriented to person, place and time. Affect is normal.  Data Reviewed No images and labs to review I have personally reviewed the patient's imaging, laboratory findings and medical records.    Assessment    External hemorrhoid    Plan    30 year old female with an external hemorrhoid that has  been present for approximately 5 days. Discussed the treatment and management of external hemorrhoids. Given that this section Lalla BrothersLambert is present for greater than 48 hours discussed that operative intervention at this time would only make it a bleeding, painful hemorrhoid. Discussed the importance of using the prescribed therapy that she started he received. I'll  provide her with a small prescription for pain medications in addition to the topical pain treatment. Discussed that should it worsen his symptoms improved she is to call clinic for earlier follow-up otherwise she'll follow up in 2 weeks for documentation of improvement and to discuss whether not operative intervention is needed at that time.     Time spent with the patient was 30 minutes, with more than 50% of the time spent in face-to-face education, counseling and care coordination.     Ricarda Frameharles Halcyon Heck, MD FACS General Surgeon 08/05/2016, 4:06 PM

## 2016-08-05 NOTE — Patient Instructions (Addendum)
Please see your follow up appointment listed below. Please call our office if you have questions or concerns.   Nonsurgical Procedures for Hemorrhoids, Care After Introduction Refer to this sheet in the next few weeks. These instructions provide you with information about caring for yourself after your procedure. Your health care provider may also give you more specific instructions. Your treatment has been planned according to current medical practices, but problems sometimes occur. Call your health care provider if you have any problems or questions after your procedure. What can I expect after the procedure? After the procedure, it is common to have:  Slight rectal bleeding for a few days.  Soreness or a dull ache in the rectal area. Follow these instructions at home: Medicines  Take over-the-counter and prescription medicines only as told by your health care provider.  Use a stool softener or a bulk laxative as told by your health care provider. Activity  Return to your normal activities as told by your health care provider. Ask your health care provider what activities are safe for you.  Do not lift anything that is heavier than 10 lb (4.5 kg).  Do not sit for long periods of time. Take a walk every day or as told by your health care provider.  Do not strain to have a bowel movement.  Do not spend a long time sitting on the toilet. Eating and drinking  Eat foods that contain fiber, such as whole grains, beans, nuts, fruits, and vegetables.  Drink enough fluid to keep your urine clear or pale yellow. General instructions  Sit in a warm bath 2-3 times a day to relieve soreness or itching.  Keep all follow-up visits as told by your health care provider. This is important. Contact a health care provider if:  Your pain medicine is not helping.  You have a fever.  You become constipated.  You continue to have light rectal bleeding for more than a few days. Get help  right away if:  You have very bad rectal pain.  You have heavy bleeding from your rectum. This information is not intended to replace advice given to you by your health care provider. Make sure you discuss any questions you have with your health care provider. Document Released: 09/13/2015 Document Revised: 01/23/2016 Document Reviewed: 11/12/2014  2017 Elsevier

## 2016-08-11 ENCOUNTER — Telehealth: Payer: Self-pay | Admitting: Surgery

## 2016-08-11 NOTE — Telephone Encounter (Signed)
Patient needs a work note from [employer] until Dec 13. She stated she is still in pain with the hemorroids.  Please call patient when it is ready to be picked up.

## 2016-08-11 NOTE — Telephone Encounter (Signed)
Tried to call patient back but her phone number was disconnected. I was going to call her mother but patient does not want us to call her. If patient calls back, her work note was left at the [employer] front desk. If patient needs additional dates to be covered, she will need to schedule a follow up appointment with Dr. Tonita CongWoodham.  Dates covered per patient's request were 08/06/2016-08/11/2016 and to return to work on 08/12/2016. These are the dates that Dr. Tonita CongWoodham will cover.

## 2016-08-21 ENCOUNTER — Telehealth: Payer: Self-pay | Admitting: Surgery

## 2016-08-21 ENCOUNTER — Ambulatory Visit: Payer: Self-pay | Admitting: Surgery

## 2016-08-21 NOTE — Telephone Encounter (Signed)
Returned phone call to patient at this time. She states that she is doing well in regards to hemorrhoid. Denies pain and bleeding. States that she has not gone back to work however.  I explained to patient that she has previously been given a note to return back on 08/12/16 without restrictions and this cannot be altered as patient has not been seen in office or called to state that there is any problem. She verbalizes understanding of this information.

## 2016-08-21 NOTE — Telephone Encounter (Signed)
Patient called and stated that she can't make it to her appointment this morning due to she has children at home and can only come to a later appointment. I offered to move her to another day and she declined. She just wants a note to go back to work. She said she was doing better but she hasn't returned to work yet. Please call her at [phone]

## 2016-08-27 ENCOUNTER — Ambulatory Visit: Payer: Medicaid Other | Admitting: Surgery

## 2016-09-01 DIAGNOSIS — Z8742 Personal history of other diseases of the female genital tract: Secondary | ICD-10-CM | POA: Insufficient documentation

## 2016-09-02 ENCOUNTER — Ambulatory Visit: Payer: Self-pay | Admitting: Surgery

## 2017-11-10 DIAGNOSIS — R030 Elevated blood-pressure reading, without diagnosis of hypertension: Secondary | ICD-10-CM | POA: Insufficient documentation

## 2017-11-10 LAB — HM PAP SMEAR: HM Pap smear: NEGATIVE

## 2017-11-12 ENCOUNTER — Encounter: Payer: Medicaid Other | Admitting: Obstetrics and Gynecology

## 2019-03-02 ENCOUNTER — Ambulatory Visit: Payer: Self-pay

## 2019-03-05 DIAGNOSIS — R011 Cardiac murmur, unspecified: Secondary | ICD-10-CM

## 2019-03-05 DIAGNOSIS — Z8742 Personal history of other diseases of the female genital tract: Secondary | ICD-10-CM

## 2019-03-05 DIAGNOSIS — R030 Elevated blood-pressure reading, without diagnosis of hypertension: Secondary | ICD-10-CM

## 2019-03-06 ENCOUNTER — Ambulatory Visit: Payer: Self-pay

## 2019-05-30 ENCOUNTER — Ambulatory Visit: Payer: Medicaid Other

## 2019-05-30 ENCOUNTER — Other Ambulatory Visit: Payer: Medicaid Other

## 2019-06-12 ENCOUNTER — Other Ambulatory Visit: Payer: Medicaid Other

## 2019-08-31 ENCOUNTER — Other Ambulatory Visit: Payer: Self-pay

## 2019-08-31 ENCOUNTER — Ambulatory Visit: Payer: Medicaid Other

## 2019-09-05 ENCOUNTER — Ambulatory Visit: Payer: Self-pay

## 2019-09-05 ENCOUNTER — Ambulatory Visit: Payer: Medicaid Other

## 2019-09-07 ENCOUNTER — Ambulatory Visit: Payer: Self-pay

## 2019-09-10 ENCOUNTER — Other Ambulatory Visit: Payer: Self-pay

## 2019-09-10 ENCOUNTER — Encounter: Payer: Self-pay | Admitting: Emergency Medicine

## 2019-09-10 ENCOUNTER — Emergency Department
Admission: EM | Admit: 2019-09-10 | Discharge: 2019-09-10 | Disposition: A | Discharge status: Home / Self Care | Payer: Medicaid Other | Attending: Emergency Medicine | Admitting: Emergency Medicine

## 2019-09-10 ENCOUNTER — Emergency Department: Payer: Medicaid Other

## 2019-09-10 DIAGNOSIS — R0789 Other chest pain: Secondary | ICD-10-CM | POA: Insufficient documentation

## 2019-09-10 DIAGNOSIS — F17210 Nicotine dependence, cigarettes, uncomplicated: Secondary | ICD-10-CM | POA: Insufficient documentation

## 2019-09-10 DIAGNOSIS — Z20822 Contact with and (suspected) exposure to covid-19: Secondary | ICD-10-CM | POA: Diagnosis not present

## 2019-09-10 DIAGNOSIS — M7918 Myalgia, other site: Secondary | ICD-10-CM | POA: Diagnosis not present

## 2019-09-10 DIAGNOSIS — R079 Chest pain, unspecified: Secondary | ICD-10-CM

## 2019-09-10 LAB — BASIC METABOLIC PANEL
Anion gap: 9 (ref 5–15)
BUN: 12 mg/dL (ref 6–20)
CO2: 24 mmol/L (ref 22–32)
Calcium: 9.4 mg/dL (ref 8.9–10.3)
Chloride: 104 mmol/L (ref 98–111)
Creatinine, Ser: 0.69 mg/dL (ref 0.44–1.00)
GFR calc Af Amer: >60 mL/min (ref >60)
GFR calc non Af Amer: >60 mL/min (ref >60)
Glucose, Bld: 91 mg/dL (ref 70–99)
Potassium: 4.1 mmol/L (ref 3.5–5.1)
Sodium: 137 mmol/L (ref 135–145)

## 2019-09-10 LAB — CBC
HCT: 36.3 % (ref 36.0–46.0)
Hemoglobin: 12 g/dL (ref 12.0–15.0)
MCH: 27.3 pg (ref 26.0–34.0)
MCHC: 33.1 g/dL (ref 30.0–36.0)
MCV: 82.5 fL (ref 80.0–100.0)
Platelets: 196 10*3/uL (ref 150–400)
RBC: 4.4 MIL/uL (ref 3.87–5.11)
RDW: 13.7 % (ref 11.5–15.5)
WBC: 2.9 10*3/uL — ABNORMAL LOW (ref 4.0–10.5)
nRBC: 0 % (ref 0.0–0.2)

## 2019-09-10 LAB — POC SARS CORONAVIRUS 2 AG: SARS Coronavirus 2 Ag: NEGATIVE

## 2019-09-10 LAB — TROPONIN I (HIGH SENSITIVITY): Troponin I (High Sensitivity): <2 ng/L (ref <18)

## 2019-09-10 NOTE — ED Notes (Signed)
After pt speaking with Darl Pikes PA, pt agreed to POC COVID, tolerated poorly.

## 2019-09-10 NOTE — ED Notes (Signed)
This RN attempted to perform POC COVID swab on pt. Pt stated "I don't know why y'all are trying to test me for that, I only came here because my body has been hurting". This RN explained that body aches can be a symptom of COVID. The pt then began stating "I know y'all give people COVID with those swabs, I don't know what's in that swab and I don't want that stuff around my kids". This RN attempted to educate pt that the COVID swab does not contain COVID. Pt refused to be tested for COVID.  Provider aware.

## 2019-09-10 NOTE — ED Triage Notes (Signed)
Pt arrived via pOV with c/o left side chest pain x 2 days and generalized body aches, denies any COVID contacts.

## 2019-09-10 NOTE — ED Provider Notes (Signed)
[facility] Emergency Department Provider Note  ____________________________________________   First MD Initiated Contact with Patient 09/10/19 1502     (approximate)  I have reviewed the triage vital signs and the nursing notes.   HISTORY  Chief Complaint Chest Pain    HPI Andrea Huang is a 34 y.o. female presents emergency department with left-sided chest pain for 2 days.  Covid-like symptoms with body aches.  Patient works at [employer] but states she has not been exposed to anyone with Covid.  She denies chest pain or shortness of breath this time.  She is complaining of some body aches.  She can still smell and taste.  She denies any vomiting or diarrhea.    Past Medical History:  Diagnosis Date  . Hemorrhoid     Patient Active Problem List   Diagnosis Date Noted  . Elevated blood pressure reading without diagnosis of hypertension 11/10/2017  . History of PID 09/01/2016  . External hemorrhoid 08/05/2016  . Heart murmur 11/12/1992    History reviewed. No pertinent surgical history.  Prior to Admission medications   Medication Sig Start Date End Date Taking? Authorizing Provider  HYDROcodone-acetaminophen (NORCO) 5-325 MG tablet Take 1 tablet by mouth every 6 (six) hours as needed for moderate pain. 08/05/16   Ricarda Frame, MD  pramoxine (PROCTOFOAM) 1 % foam Place 1 application rectally 3 (three) times daily as needed for hemorrhoids. 08/05/16   Ricarda Frame, MD    Allergies Patient has no known allergies.  Family History  Family history unknown: Yes    Social History Social History   Tobacco Use  . Smoking status: Current Every Day Smoker    Packs/day: 0.25    Types: Cigarettes  . Smokeless tobacco: Never Used  Substance Use Topics  . Alcohol use: No  . Drug use: No    Review of Systems  Constitutional: No fever/chills, positive body aches Eyes: No visual changes. ENT: No sore throat. Respiratory: Denies  cough Cardiovascular: Positive chest pain Gastrointestinal: Denies abdominal pain Genitourinary: Negative for dysuria. Musculoskeletal: Negative for back pain. Skin: Negative for rash. Psychiatric: no mood changes,     ____________________________________________   PHYSICAL EXAM:  VITAL SIGNS: ED Triage Vitals  Enc Vitals Group     BP 09/10/19 1246 131/89     Pulse Rate 09/10/19 1246 80     Resp 09/10/19 1246 16     Temp 09/10/19 1246 99.1 F (37.3 C)     Temp Source 09/10/19 1246 Oral     SpO2 09/10/19 1246 100 %     Weight 09/10/19 1249 125 lb (56.7 kg)     Height 09/10/19 1249 5' (1.524 m)     Head Circumference --      Peak Flow --      Pain Score 09/10/19 1248 10     Pain Loc --      Pain Edu? --      Excl. in [place]? --     Constitutional: Alert and oriented. Well appearing and in no acute distress. Eyes: Conjunctivae are normal.  Head: Atraumatic. Nose: No congestion/rhinnorhea. Mouth/Throat: Mucous membranes are moist.   Neck:  supple no lymphadenopathy noted Cardiovascular: Normal rate, regular rhythm. Heart sounds are normal Respiratory: Normal respiratory effort.  No retractions, lungs c t a  GU: deferred Musculoskeletal: FROM all extremities, warm and well perfused Neurologic:  Normal speech and language.  Skin:  Skin is warm, dry and intact. No rash noted. Psychiatric: Mood and  affect are normal. Speech and behavior are normal.  ____________________________________________   LABS (all labs ordered are listed, but only abnormal results are displayed)  Labs Reviewed  CBC - Abnormal; Notable for the following components:      Result Value   WBC 2.9 (*)    All other components within normal limits  BASIC METABOLIC PANEL  POC SARS CORONAVIRUS 2 AG -  ED  POC SARS CORONAVIRUS 2 AG  POC URINE PREG, ED  TROPONIN I (HIGH SENSITIVITY)   ____________________________________________   ____________________________________________  RADIOLOGY  Chest  x-ray is normal  ____________________________________________   PROCEDURES  Procedure(s) performed: EKG shows normal sinus rhythm   Procedures    ____________________________________________   INITIAL IMPRESSION / ASSESSMENT AND PLAN / ED COURSE  Pertinent labs & imaging results that were available during my care of the patient were reviewed by me and considered in my medical decision making (see chart for details).   Patient is 34 year old female presents emergency department with concerns of chest pain and body aches.  Similar to Covid symptoms.  See HPI  Physical exam patient appears well.  Vitals normal.  Exam is basically unremarkable  DDx: Viral illness, Covid, MI  POC Covid is negative, troponin is normal, basic metabolic panel is normal, CBC has a low WBC of 2.9  Explained the findings to the patient.  The patient is very argumentative about Covid swab and does not want the additional Covid swab done.  She is basically concerned that she will get kicked out of the house where she is staying.  They have told her that if she has Covid she will have to leave.  She states I am homeless and have 2 small children so I'm  dependent on them.  Explained to her it is still very important for the public health and the health of her children to see if she has Covid.  Patient did agree to the point-of-care Covid but refused the additional Covid after her test was negative.  She was encouraged to return if she is worsening.  Explained to her she should wear her mask as much as possible while in the house and around her children as a feel like she does have Covid-like symptoms.  The patient had been quite rude to the nurses.  I did try to calm the situation down.  She was receptive to my discussion.  She was given a work note and was discharged in stable condition.    Andrea Huang was evaluated in Emergency Department on 09/10/2019 for the symptoms described in the history of present  illness. She was evaluated in the context of the global COVID-19 pandemic, which necessitated consideration that the patient might be at risk for infection with the SARS-CoV-2 virus that causes COVID-19. Institutional protocols and algorithms that pertain to the evaluation of patients at risk for COVID-19 are in a state of rapid change based on information released by regulatory bodies including the CDC and federal and state organizations. These policies and algorithms were followed during the patient's care in the ED.   As part of my medical decision making, I reviewed the following data within the electronic MEDICAL RECORD NUMBER Nursing notes reviewed and incorporated, Labs reviewed see above, EKG interpreted NSR, Old chart reviewed, Radiograph reviewed chest x-ray normal, Notes from prior ED visits and [place] Controlled Substance Database  ____________________________________________   FINAL CLINICAL IMPRESSION(S) / ED DIAGNOSES  Final diagnoses:  Nonspecific chest pain      NEW  MEDICATIONS STARTED DURING THIS VISIT:  Discharge Medication List as of 09/10/2019  4:02 PM       Note:  This document was prepared using Dragon voice recognition software and may include unintentional dictation errors.    Versie Starks, PA-C 09/10/19 1754    Lavonia Drafts, MD 09/10/19 [phone]

## 2019-09-10 NOTE — Discharge Instructions (Addendum)
Follow-up with your regular doctor if not improving in 3 days.  Your Covid test today was negative.  If worsening please return we will do an additional Covid test.  Follow-up with your regular doctor as needed.

## 2019-11-13 ENCOUNTER — Ambulatory Visit: Payer: Medicaid Other

## 2019-11-21 ENCOUNTER — Ambulatory Visit: Payer: Medicaid Other

## 2019-12-19 ENCOUNTER — Ambulatory Visit: Payer: Medicaid Other

## 2019-12-20 ENCOUNTER — Encounter: Payer: Self-pay | Admitting: Emergency Medicine

## 2019-12-20 ENCOUNTER — Other Ambulatory Visit: Payer: Self-pay

## 2019-12-20 ENCOUNTER — Emergency Department
Admission: EM | Admit: 2019-12-20 | Discharge: 2019-12-20 | Disposition: A | Discharge status: Home / Self Care | Payer: Medicaid Other | Attending: Student | Admitting: Student

## 2019-12-20 DIAGNOSIS — F17210 Nicotine dependence, cigarettes, uncomplicated: Secondary | ICD-10-CM | POA: Diagnosis not present

## 2019-12-20 DIAGNOSIS — U071 COVID-19: Secondary | ICD-10-CM | POA: Diagnosis not present

## 2019-12-20 DIAGNOSIS — R0781 Pleurodynia: Secondary | ICD-10-CM | POA: Diagnosis present

## 2019-12-20 DIAGNOSIS — Z20822 Contact with and (suspected) exposure to covid-19: Secondary | ICD-10-CM

## 2019-12-20 NOTE — ED Notes (Signed)
See triage note- pt reports being around someone who was exposed to covid, pt has not been tested. Started feeling fatigued and having nasal congestion yesterday.

## 2019-12-20 NOTE — ED Provider Notes (Signed)
[facility] Emergency Department Provider Note  ____________________________________________   First MD Initiated Contact with Patient 12/20/19 1433     (approximate)  I have reviewed the triage vital signs and the nursing notes.   HISTORY  Chief Complaint Nasal Congestion    HPI Andrea Huang is a 34 y.o. female presents emergency department  with complaints of nasal congestion, loss of sense of taste and smell.  States she was exposed to someone last week that was positive for Covid.  She did not have any symptoms so she did not get tested at that time.  She also has not quarantine.  She denies any fever chills.  No chest pain or shortness of breath.   Past Medical History:  Diagnosis Date  . Hemorrhoid     Patient Active Problem List   Diagnosis Date Noted  . Elevated blood pressure reading without diagnosis of hypertension 11/10/2017  . History of PID 09/01/2016  . External hemorrhoid 08/05/2016  . Heart murmur 11/12/1992    History reviewed. No pertinent surgical history.  Prior to Admission medications   Medication Sig Start Date End Date Taking? Authorizing Provider  HYDROcodone-acetaminophen (NORCO) 5-325 MG tablet Take 1 tablet by mouth every 6 (six) hours as needed for moderate pain. 08/05/16   Clayburn Pert, MD  pramoxine (PROCTOFOAM) 1 % foam Place 1 application rectally 3 (three) times daily as needed for hemorrhoids. 08/05/16   Clayburn Pert, MD    Allergies Patient has no known allergies.  Family History  Family history unknown: Yes    Social History Social History   Tobacco Use  . Smoking status: Current Every Day Smoker    Packs/day: 0.25    Types: Cigarettes  . Smokeless tobacco: Never Used  Substance Use Topics  . Alcohol use: No  . Drug use: No    Review of Systems  Constitutional: No fever/chills Eyes: No visual changes. ENT: No sore throat. Respiratory: Denies cough Cardiovascular: Denies  chest pain Gastrointestinal: Denies abdominal pain Genitourinary: Negative for dysuria. Musculoskeletal: Negative for back pain. Skin: Negative for rash. Psychiatric: no mood changes,     ____________________________________________   PHYSICAL EXAM:  VITAL SIGNS: ED Triage Vitals  Enc Vitals Group     BP 12/20/19 1203 (!) 140/99     Pulse Rate 12/20/19 1203 88     Resp 12/20/19 1203 18     Temp 12/20/19 1203 98.4 F (36.9 C)     Temp Source 12/20/19 1203 Oral     SpO2 12/20/19 1203 100 %     Weight 12/20/19 1204 130 lb (59 kg)     Height 12/20/19 1204 5' (1.524 m)     Head Circumference --      Peak Flow --      Pain Score 12/20/19 1210 0     Pain Loc --      Pain Edu? --      Excl. in [place]? --     Constitutional: Alert and oriented. Well appearing and in no acute distress. Eyes: Conjunctivae are normal.  Head: Atraumatic. Nose: No congestion/rhinnorhea. Mouth/Throat: Mucous membranes are moist.   Neck:  supple no lymphadenopathy noted Cardiovascular: Normal rate, regular rhythm. Heart sounds are normal Respiratory: Normal respiratory effort.  No retractions, lungs c t a  GU: deferred Musculoskeletal: FROM all extremities, warm and well perfused Neurologic:  Normal speech and language.  Skin:  Skin is warm, dry and intact. No rash noted. Psychiatric: Mood and affect are normal.  Speech and behavior are normal.  ____________________________________________   LABS (all labs ordered are listed, but only abnormal results are displayed)  Labs Reviewed  SARS CORONAVIRUS 2 (TAT 6-24 HRS)   ____________________________________________   ____________________________________________  RADIOLOGY    ____________________________________________   PROCEDURES  Procedure(s) performed: No  Procedures    ____________________________________________   INITIAL IMPRESSION / ASSESSMENT AND PLAN / ED COURSE  Pertinent labs & imaging results that were available  during my care of the patient were reviewed by me and considered in my medical decision making (see chart for details).   Patient is 34 year old female presents emergency department with concerns of Covid.  She has lost her sense of taste and smell.  Has nasal congestion.  No chest pain or shortness of breath.  Symptoms started yesterday.  Physical exam shows patient to appear well.  Physical exam is unremarkable.  Explained to the patient that she has a clinical diagnosis of Covid as she has lost her sense of taste and smell.  We will do a Covid test here.  Results should be available in 6 to 24 hours.  She can check her MyChart and we can call her with her results.  She should continue to quarantine.  Explained to her that due to the loss of sense of taste and smell even if her test is negative I would consider her to be Covid positive.  He states she understands will comply.  She is discharged stable condition.    Andrea Huang was evaluated in Emergency Department on 12/20/2019 for the symptoms described in the history of present illness. She was evaluated in the context of the global COVID-19 pandemic, which necessitated consideration that the patient might be at risk for infection with the SARS-CoV-2 virus that causes COVID-19. Institutional protocols and algorithms that pertain to the evaluation of patients at risk for COVID-19 are in a state of rapid change based on information released by regulatory bodies including the CDC and federal and state organizations. These policies and algorithms were followed during the patient's care in the ED.   As part of my medical decision making, I reviewed the following data within the electronic MEDICAL RECORD NUMBER Nursing notes reviewed and incorporated, Old chart reviewed, Notes from prior ED visits and [place] Controlled Substance Database  ____________________________________________   FINAL CLINICAL IMPRESSION(S) / ED DIAGNOSES  Final diagnoses:    Suspected COVID-19 virus infection      NEW MEDICATIONS STARTED DURING THIS VISIT:  New Prescriptions   No medications on file     Note:  This document was prepared using Dragon voice recognition software and may include unintentional dictation errors.    Faythe Ghee, PA-C 12/20/19 1510    Miguel Aschoff., MD 12/21/19 [phone]

## 2019-12-20 NOTE — Discharge Instructions (Addendum)
Stay quarantined until you receive your Covid results.  If negative you may return to your normal activities once you are feeling better.  Due to the fact that you have a loss of sense of taste and smell you have a clinical diagnosis of Covid.  It would be best for you to quarantine for 10 days.  Notify anyone that you have been near that you have Covid.  If you develop chest pain or shortness of breath please return emergency department.

## 2019-12-20 NOTE — ED Triage Notes (Signed)
C/O nasal congestion and loss of taste since yesterday.  Patient is AAOx3.  Skin warm and dry. NAD

## 2019-12-21 ENCOUNTER — Telehealth: Payer: Self-pay | Admitting: *Deleted

## 2019-12-21 ENCOUNTER — Ambulatory Visit: Payer: Self-pay | Admitting: *Deleted

## 2019-12-21 LAB — SARS CORONAVIRUS 2 (TAT 6-24 HRS): SARS Coronavirus 2: POSITIVE — AB

## 2019-12-21 NOTE — Telephone Encounter (Signed)
Patient notified of + COVID test result. Questioned about symptoms, instructed in cleansing and quarantine and isolation practices, and dates with criteria to end each. Instructed when to seek medical care at Urgent Care/ER, masking if leave isolation. Educated on why re-testing is not necessary. Pt to notify PCP incase further follow up needed and informed that the [facility] Department would be notified. Questions answered, pt verbalized understanding of instructions.

## 2019-12-21 NOTE — Telephone Encounter (Signed)
Patient notified of + COVID test result. Questioned about symptoms, instructed in cleansing and quarantine and isolation practices, and dates with criteria to end each. Instructed when to seek medical care at Urgent Care/ER, masking if leave isolation. Educated on why re-testing is not necessary. Pt to notify PCP incase further follow up needed and informed that the [facility] would be notified. Questions answered, pt verbalized understanding of instructions.   

## 2019-12-21 NOTE — Telephone Encounter (Signed)
  Reason for Disposition . [1] COVID-19 diagnosed by positive lab test AND [2] mild symptoms (e.g., cough, fever, others) AND [3] no complications or SOB  Answer Assessment - Initial Assessment Questions 1. COVID-19 DIAGNOSIS: "Who made your Coronavirus (COVID-19) diagnosis?" "Was it confirmed by a positive lab test?" If not diagnosed by a HCP, ask "Are there lots of cases (community spread) where you live?" (See public health department website, if unsure)     yes   6. FEVER: "Do you have a fever?" If so, ask: "What is your temperature, how was it measured, and when did it start?"     no 7. RESPIRATORY STATUS: "Describe your breathing?" (e.g., shortness of breath, wheezing, unable to speak)      no 8. BETTER-SAME-WORSE: "Are you getting better, staying the same or getting worse compared to yesterday?"  If getting worse, ask, "In what way?"     better 9. HIGH RISK DISEASE: "Do you have any chronic medical problems?" (e.g., asthma, heart or lung disease, weak immune system, obesity, etc.)     no  11. OTHER SYMPTOMS: "Do you have any other symptoms?"  (e.g., chills, fatigue, headache, loss of smell or taste, muscle pain, sore throat; new loss of smell or taste especially support the diagnosis of COVID-19)       Loss of smell and taste  Protocols used: CORONAVIRUS (COVID-19) DIAGNOSED OR SUSPECTED-A-AH

## 2019-12-22 ENCOUNTER — Telehealth: Payer: Self-pay | Admitting: Licensed Clinical Social Worker

## 2019-12-22 ENCOUNTER — Telehealth: Payer: Self-pay | Admitting: Adult Health

## 2019-12-22 NOTE — Telephone Encounter (Signed)
Called patient as she tested positive for COVID 19 on 12/20/2019.  She does not meet criteria for COVID19 infusion with monoclonal antibody therapy, however she notes that she has no money and nowhere to quarantine.  She lost the number she was given in the ER two days ago.  I let her know that I would reach out to the ER social worker.    I spoke with [place] a Child psychotherapist at [facility] ER.  She is going to reach out to the patient to assist her with getting housing quarantine accommodations.  I gave Naleigha Raimondi phone number.    Lillard Anes, NP

## 2020-05-21 ENCOUNTER — Ambulatory Visit: Payer: Medicaid Other

## 2020-09-10 ENCOUNTER — Ambulatory Visit (LOCAL_COMMUNITY_HEALTH_CENTER): Payer: Medicaid Other | Admitting: Physician Assistant

## 2020-09-10 ENCOUNTER — Encounter: Payer: Self-pay | Admitting: Physician Assistant

## 2020-09-10 ENCOUNTER — Other Ambulatory Visit: Payer: Self-pay

## 2020-09-10 VITALS — BP 154/107 | Ht 62.0 in | Wt 128.2 lb

## 2020-09-10 DIAGNOSIS — Z01419 Encounter for gynecological examination (general) (routine) without abnormal findings: Secondary | ICD-10-CM | POA: Diagnosis not present

## 2020-09-10 DIAGNOSIS — Z3009 Encounter for other general counseling and advice on contraception: Secondary | ICD-10-CM | POA: Diagnosis not present

## 2020-09-10 DIAGNOSIS — Z113 Encounter for screening for infections with a predominantly sexual mode of transmission: Secondary | ICD-10-CM

## 2020-09-10 LAB — URINALYSIS
Bilirubin, UA: NEGATIVE
Glucose, UA: NEGATIVE
Ketones, UA: NEGATIVE
Leukocytes,UA: NEGATIVE
Nitrite, UA: NEGATIVE
Protein,UA: NEGATIVE
RBC, UA: NEGATIVE
Specific Gravity, UA: 1.025 (ref 1.005–1.030)
Urobilinogen, Ur: 0.2 mg/dL (ref 0.2–1.0)
pH, UA: 5.5 (ref 5.0–7.5)

## 2020-09-10 LAB — URINALYSIS, MICROSCOPIC ONLY
Casts: NONE SEEN /lpf
Renal Epithel, UA: NONE SEEN /hpf
Trichomonas, UA: NONE SEEN
Yeast, UA: NONE SEEN

## 2020-09-10 LAB — PREGNANCY, URINE: Preg Test, Ur: NEGATIVE

## 2020-09-10 NOTE — Progress Notes (Unsigned)
UPT is negative today. Upon returning from lab pt reports that she needs to leave for a family emergency. Sadie Haber, PA reviewed urine and states that urine looks good but to counsel pt to take multivitamin, reduce soda intake and increase water intake, and for pt to do another pregnancy test if she does not start her period by this time next week, and for pt to go to Urgent Care or ER to have BP evaluated. Counseled pt per Sadie Haber, PA and pt states understanding of counseling. Pt aware that if she changes her mind and desires birth control method later on to give [place] a call so she can RTC and pt states understanding. 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

## 2020-09-10 NOTE — Progress Notes (Unsigned)
Pt is here for physical. Pt's BP 154/107 and pt denies any headache, chest pain, trouble breathing or dizziness. Pt reports having breast pain and some lower abdomen cramping over the past 2 weeks and some nausea. Consulted with Sadie Haber, PA regarding pt's BP and pt symptoms and per Sadie Haber, PA recommend pt go to ER or Urgent Care to be evaluated for BP. Counseled pt on importance of getting BP evaluated and risk of elevated BP of stroke and heart attack, etc. and pt states understanding of counseling but really would like to stay for appt today. Consulted with Sadie Haber, PA who states we cannot force pt to leave to go to ER or Urgent Care so it is up to pt. Pt decides to stay for appt and advised pt to let us know ASAP if develops any symptoms or decides that she wants to leave to go to ER or Urgent Care, and pt states understanding. Pt here for physical. Pt reports pain in breasts, lower abd cramping and some nausea that has been going on for about 2 weeks. Pt reports she desires pregnancy and is not interested in birth control today.

## 2020-09-11 ENCOUNTER — Encounter: Payer: Self-pay | Admitting: Physician Assistant

## 2020-09-11 NOTE — Progress Notes (Signed)
[facility] DEPARTMENT [facility] [address] Main Number: [phone]    Family Planning Visit- Initial Visit  Subjective:  OZELLE BRUBACHER is a 35 y.o.  [id number]   being seen today for an initial well woman visit and to discuss family planning options.  She is currently using None for pregnancy prevention. Patient reports she does want a pregnancy in the next year.  Patient has the following medical conditions has External hemorrhoid; Elevated blood pressure reading without diagnosis of hypertension; Heart murmur; and History of PID on their problem list.  Chief Complaint  Patient presents with  . Contraception    Physical    Patient reports that she has been having breast "soreness" for about 2 weeks and it is worse with wearing a bra, she also reports nausea off and on for 2 weeks,feeling sleepy a lot, urinary pressure, frequency and urgency but denies hesitancy.  Per chart review, patient with CBE and pap due now. Patient denies history of elevated BP in the past.  States that she drinks a lot of soda daily with some juice and water.    Patient denies any other concerns today.    Body mass index is 23.45 kg/m. - Patient is eligible for diabetes screening based on BMI and age >60?  not applicable HA1C ordered? not applicable  Patient reports 1  partner/s in last year. Desires STI screening?  Yes  Has patient been screened once for HCV in the past?  No  No results found for: HCVAB  Does the patient have current drug use (including MJ), have a partner with drug use, and/or has been incarcerated since last result? No  If yes-- Screen for HCV through [facility]   Does the patient meet criteria for HBV testing? No  Criteria:  -Household, sexual or needle sharing contact with HBV -History of drug use -HIV positive -Those with known Hep C   Health Maintenance Due  Topic Date Due  . Hepatitis C Screening  Never done  . COVID-19  Vaccine (1) Never done  . TETANUS/TDAP  Never done  . INFLUENZA VACCINE  Never done    Review of Systems  All other systems reviewed and are negative.   The following portions of the patient's history were reviewed and updated as appropriate: allergies, current medications, past family history, past medical history, past social history, past surgical history and problem list. Problem list updated.   See flowsheet for other program required questions.  Objective:   Vitals:   09/10/20 1338  BP: (!) 154/107  Weight: 128 lb 3.2 oz (58.2 kg)  Height: 5\' 2"  (1.575 m)    Physical Exam Vitals and nursing note reviewed.  Constitutional:      General: She is not in acute distress.    Appearance: Normal appearance.  HENT:     Head: Normocephalic and atraumatic.     Mouth/Throat:     Mouth: Mucous membranes are moist.     Pharynx: Oropharynx is clear. No oropharyngeal exudate or posterior oropharyngeal erythema.  Eyes:     Conjunctiva/sclera: Conjunctivae normal.  Neck:     Thyroid: No thyroid mass, thyromegaly or thyroid tenderness.  Cardiovascular:     Rate and Rhythm: Normal rate and regular rhythm.  Pulmonary:     Effort: Pulmonary effort is normal.     Breath sounds: Normal breath sounds.  Chest:  Breasts:     Right: Normal. No mass, nipple discharge, skin change, tenderness, axillary  adenopathy or supraclavicular adenopathy.     Left: Normal. No mass, nipple discharge, skin change, tenderness, axillary adenopathy or supraclavicular adenopathy.    Abdominal:     Palpations: Abdomen is soft. There is no mass.     Tenderness: There is no abdominal tenderness. There is no guarding or rebound.  Genitourinary:    General: Normal vulva.     Rectum: Normal.     Comments: External genitalia/pubic area without nits, lice, edema, erythema, lesions and inguinal adenopathy. Vagina with normal mucosa and discharge. Cervix without visible lesions. Uterus firm, mobile, nt, no  masses, no CMT, no adnexal tenderness or fullness. Musculoskeletal:     Cervical back: Neck supple. No tenderness.  Lymphadenopathy:     Cervical: No cervical adenopathy.     Upper Body:     Right upper body: No supraclavicular, axillary or pectoral adenopathy.     Left upper body: No supraclavicular, axillary or pectoral adenopathy.  Skin:    General: Skin is warm and dry.     Findings: No bruising, erythema, lesion or rash.  Neurological:     Mental Status: She is alert and oriented to person, place, and time.  Psychiatric:        Mood and Affect: Mood normal.        Behavior: Behavior normal.        Thought Content: Thought content normal.        Judgment: Judgment normal.       Assessment and Plan:  OZIE DIMARIA is a 35 y.o. female presenting to the [facility] Department for an initial well woman exam/family planning visit  Contraception counseling: Reviewed all forms of birth control options in the tiered based approach. available including abstinence; over the counter/barrier methods; hormonal contraceptive medication including pill, patch, ring, injection,contraceptive implant, ECP; hormonal and nonhormonal IUDs; permanent sterilization options including vasectomy and the various tubal sterilization modalities. Risks, benefits, and typical effectiveness rates were reviewed.  Questions were answered.  Written information was also given to the patient to review.  Patient desires to achieve pregnancy. She will follow up in  1 year and prn for surveillance.  She was told to call with any further questions, or with any concerns about this method of contraception.  Emphasized use of condoms 100% of the time for STI prevention.  Patient not a candidate for ECP today.   1. Encounter for counseling regarding contraception Counseled patient re: importance of follow up if she does not have a period in the next week. Patient had initially stated that last sex was  08/23/2020, but after checking her app on her phone states that she last had sex on 08/31/2020. Counseled patient to RTC if she changes her mind and wants to use a hormonal BCM. - Pregnancy, urine  2. Screening for STD (sexually transmitted disease) Await test results.  Counseled that RN will call if needs to RTC for treatment once results are back.  - Chlamydia/Gonorrhea [facility] [place] [facility]  3. Well woman exam with routine gynecological exam Reviewed with patient healthy habits to maintain general health. Stressed to patient importance of follow up re: elevated BP and sequelae if high BP is not treated. Enc patient to get regular sleep, regular meals, avoid EtOH, tobacco, excess caffeine, and other teratogens. Enc patient to have BP evaluated and controlled prior to getting pregnant if possible, but counseled patient re: risks to fetus with elevated BP in mom. Urine dip and micro reviewed and normal, no indication for  culture.  Rec patient decrease soda and juice and increase water intake. Enc MVI 1 po daily or OTC PNV 1 po daily. Enc to establish with/ follow up with PCP for primary care concerns, age appropriate screenings and illness. - IGP, Aptima HPV - Urinalysis (Urine Dip) - Urine Microscopic     Return if symptoms worsen or fail to improve, for annual and PRN.  No future appointments.  Matt Holmes, PA

## 2020-09-13 LAB — IGP, APTIMA HPV
HPV Aptima: NEGATIVE
PAP Smear Comment: 0

## 2020-11-22 ENCOUNTER — Other Ambulatory Visit: Payer: Medicaid Other

## 2020-11-25 ENCOUNTER — Other Ambulatory Visit: Payer: Medicaid Other

## 2020-12-26 ENCOUNTER — Encounter: Payer: Self-pay | Admitting: Physician Assistant

## 2020-12-26 NOTE — Progress Notes (Signed)
Pap smear results from January not seen by provider until today, 12/26/2020.  Patient had pap that was NILM/ HPV negative, so will need a cotest pap in 5 years.

## 2020-12-30 ENCOUNTER — Encounter: Payer: Self-pay | Admitting: Nurse Practitioner

## 2020-12-30 NOTE — Progress Notes (Signed)
PAP normal, HPV negative.  Repeat PAP in 5 years (08/2025) per Sadie Haber, PA. Routed 12/26/2020.  PAP card mailed today.  MyCHART account not active. Glenna Fellows, RN

## 2021-03-22 ENCOUNTER — Other Ambulatory Visit: Payer: Self-pay

## 2021-03-22 ENCOUNTER — Emergency Department: Payer: Medicaid Other

## 2021-03-22 ENCOUNTER — Encounter: Payer: Self-pay | Admitting: Emergency Medicine

## 2021-03-22 ENCOUNTER — Emergency Department
Admission: EM | Admit: 2021-03-22 | Discharge: 2021-03-22 | Disposition: A | Discharge status: Home / Self Care | Payer: Medicaid Other | Attending: Emergency Medicine | Admitting: Emergency Medicine

## 2021-03-22 DIAGNOSIS — R52 Pain, unspecified: Secondary | ICD-10-CM

## 2021-03-22 DIAGNOSIS — M79671 Pain in right foot: Secondary | ICD-10-CM | POA: Diagnosis present

## 2021-03-22 DIAGNOSIS — F17210 Nicotine dependence, cigarettes, uncomplicated: Secondary | ICD-10-CM | POA: Insufficient documentation

## 2021-03-22 MED ORDER — IBUPROFEN 800 MG PO TABS
800.0000 mg | ORAL_TABLET | Freq: Once | ORAL | Status: AC
  Administered 2021-03-22: 800 mg via ORAL
  Filled 2021-03-22: qty 1

## 2021-03-22 MED ORDER — MELOXICAM 15 MG PO TABS: 15.0000 mg | ORAL_TABLET | Freq: Every day | ORAL | 2 refills | Status: DC

## 2021-03-22 NOTE — Discharge Instructions (Addendum)
Follow-up with your regular doctor as needed Follow-up with podiatry if not better in 1 week By shoes with good support Wear the postop shoe for 1 week Apply ice Take your medication as prescribed Return if worse

## 2021-03-22 NOTE — ED Triage Notes (Signed)
Pt reports pain to her left foot for the past month. Pt denies obvious injuries, no swelling noted. Pt states hurts to walk on. Pt states pain is foot not ankle.

## 2021-03-22 NOTE — ED Provider Notes (Signed)
[facility] Emergency Department Provider Note  ____________________________________________   Event Date/Time   First MD Initiated Contact with Patient 03/22/21 1053     (approximate)  I have reviewed the triage vital signs and the nursing notes.   HISTORY  Chief Complaint Foot Pain    HPI Andrea Huang is a 35 y.o. female presents emergency department with right foot pain for about 1 month.  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  She denies any known injury.  States the pain will shoot down the top of her foot and is underneath her foot 2.  No redness or swelling per the patient.  Past Medical History:  Diagnosis Date   Anemia    Hemorrhoid     Patient Active Problem List   Diagnosis Date Noted   Elevated blood pressure reading without diagnosis of hypertension 11/10/2017   History of PID 09/01/2016   External hemorrhoid 08/05/2016   Heart murmur 11/12/1992    Past Surgical History:  Procedure Laterality Date   CESAREAN SECTION      Prior to Admission medications   Medication Sig Start Date End Date Taking? Authorizing Provider  meloxicam (MOBIC) 15 MG tablet Take 1 tablet (15 mg total) by mouth daily. 03/22/21 03/22/22 Yes Roylee Chaffin, Roselyn Bering, PA-C    Allergies Other  Family History  Problem Relation Age of Onset   Diabetes Mother    Hypertension Mother     Social History Social History   Tobacco Use   Smoking status: Some Days    Packs/day: 0.25    Types: Cigarettes   Smokeless tobacco: Never  Vaping Use   Vaping Use: Never used  Substance Use Topics   Alcohol use: No   Drug use: No    Review of Systems  Constitutional: No fever/chills Eyes: No visual changes. ENT: No sore throat. Respiratory: Denies cough Genitourinary: Negative for dysuria. Musculoskeletal: Negative for back pain.  Positive right foot pain Skin: Negative for rash. Psychiatric: no mood changes,      ____________________________________________   PHYSICAL EXAM:  VITAL SIGNS: ED Triage Vitals  Enc Vitals Group     BP 03/22/21 1023 (!) 141/100     Pulse Rate 03/22/21 1023 85     Resp 03/22/21 1023 20     Temp 03/22/21 1023 98.8 F (37.1 C)     Temp Source 03/22/21 1023 Oral     SpO2 03/22/21 1023 99 %     Weight 03/22/21 1000 125 lb (56.7 kg)     Height 03/22/21 1000 5' (1.524 m)     Head Circumference --      Peak Flow --      Pain Score 03/22/21 1000 10     Pain Loc --      Pain Edu? --      Excl. in GC? --     Constitutional: Alert and oriented. Well appearing and in no acute distress. Eyes: Conjunctivae are normal.  Head: Atraumatic. Nose: No congestion/rhinnorhea. Mouth/Throat: Mucous membranes are moist.   Neck:  supple no lymphadenopathy noted Cardiovascular: Normal rate, regular rhythm.  Respiratory: Normal respiratory effort.  No retractions, l GU: deferred Musculoskeletal: FROM all extremities, warm and well perfused, right foot is tender along the plantar tendon, tender across the metatarsals, neurovascular is intact Neurologic:  Normal speech and language.  Skin:  Skin is warm, dry and intact. No rash noted. Psychiatric: Mood and affect are normal. Speech and behavior are  normal.  ____________________________________________   LABS (all labs ordered are listed, but only abnormal results are displayed)  Labs Reviewed - No data to display ____________________________________________   ____________________________________________  RADIOLOGY  X-ray of the right foot  ____________________________________________   PROCEDURES  Procedure(s) performed: Patient was placed in Ace wrap and postop shoe by nursing staff  Procedures    ____________________________________________   INITIAL IMPRESSION / ASSESSMENT AND PLAN / ED COURSE  Pertinent labs & imaging results that were available during my care of the patient were reviewed by me and  considered in my medical decision making (see chart for details).   Patient's 35 year old female presents with foot pain.  She HPI.  Physical exam shows patient stable.  X-ray of the right foot reviewed by me confirmed by radiology to be negative for fracture.  Did explain the findings to the patient.  Patient shoes that she wears to work are very easy to twist and show no support.  Splane to her that she needs to wear good support shoes while standing especially on concrete.  She was given several different brands to buy.  Follow-up with podiatry if not improving in 5 to 7 days.  Return if worsening.  Discharged stable condition.   Andrea Huang was evaluated in Emergency Department on 03/22/2021 for the symptoms described in the history of present illness. She was evaluated in the context of the global COVID-19 pandemic, which necessitated consideration that the patient might be at risk for infection with the SARS-CoV-2 virus that causes COVID-19. Institutional protocols and algorithms that pertain to the evaluation of patients at risk for COVID-19 are in a state of rapid change based on information released by regulatory bodies including the CDC and federal and state organizations. These policies and algorithms were followed during the patient's care in the ED.    As part of my medical decision making, I reviewed the following data within the electronic MEDICAL RECORD NUMBER Nursing notes reviewed and incorporated, Old chart reviewed, Radiograph reviewed , Notes from prior ED visits, and [place] Controlled Substance Database  ____________________________________________   FINAL CLINICAL IMPRESSION(S) / ED DIAGNOSES  Final diagnoses:  Foot pain, right      NEW MEDICATIONS STARTED DURING THIS VISIT:  Discharge Medication List as of 03/22/2021 11:39 AM     START taking these medications   Details  meloxicam (MOBIC) 15 MG tablet Take 1 tablet (15 mg total) by mouth daily., Starting Sat  03/22/2021, Until Sun 03/22/2022, Normal         Note:  This document was prepared using Dragon voice recognition software and may include unintentional dictation errors.    Faythe Ghee, PA-C 03/22/21 1322    Gilles Chiquito, MD 03/22/21 [phone]

## 2021-04-18 ENCOUNTER — Emergency Department: Payer: Medicaid Other

## 2021-04-18 ENCOUNTER — Other Ambulatory Visit: Payer: Self-pay

## 2021-04-18 ENCOUNTER — Emergency Department
Admission: EM | Admit: 2021-04-18 | Discharge: 2021-04-18 | Disposition: A | Discharge status: Home / Self Care | Payer: Medicaid Other | Attending: Emergency Medicine | Admitting: Emergency Medicine

## 2021-04-18 DIAGNOSIS — M79671 Pain in right foot: Secondary | ICD-10-CM | POA: Insufficient documentation

## 2021-04-18 DIAGNOSIS — F17210 Nicotine dependence, cigarettes, uncomplicated: Secondary | ICD-10-CM | POA: Insufficient documentation

## 2021-04-18 MED ORDER — ACETAMINOPHEN 500 MG PO TABS
1000.0000 mg | ORAL_TABLET | Freq: Once | ORAL | Status: AC
  Administered 2021-04-18: 1000 mg via ORAL
  Filled 2021-04-18: qty 2

## 2021-04-18 MED ORDER — IBUPROFEN 400 MG PO TABS
400.0000 mg | ORAL_TABLET | Freq: Once | ORAL | Status: AC
  Administered 2021-04-18: 400 mg via ORAL
  Filled 2021-04-18: qty 1

## 2021-04-18 NOTE — ED Triage Notes (Signed)
Pt comes with c/o right foot pain for few days. Pt states no injuries. Pt states it is just painful.

## 2021-04-18 NOTE — ED Notes (Signed)
Patient declined discharge vital signs. 

## 2021-04-18 NOTE — ED Notes (Signed)
See triage note  Presents with pain to right foot  States pain is mainly to top of foot,toes and lateral foot  Denies any injury

## 2021-04-18 NOTE — ED Provider Notes (Signed)
[facility] Emergency Department Provider Note  ____________________________________________   Event Date/Time   First MD Initiated Contact with Patient 04/18/21 1025     (approximate)  I have reviewed the triage vital signs and the nursing notes.   HISTORY  Chief Complaint Foot Pain   HPI Andrea Huang is a 35 y.o. female with a past medical history of anemia and hemorrhoids who presents for assessment of some nontraumatic pain on the top of her right foot that she states has been on and off over the last 2 or 3 months.  She does not recall any preceding injuries or falls.  She states it is primarily on the top of her foot and sometimes will radiate towards the heel.  No pain under the foot or on the right side.  No pain in the leg or left lower extremity ulcer.  She denies any fevers, chills, cough, nausea, vomiting, Derica dysuria, rash or any other acute sick symptoms.  He does endorse wearing closed toed shoes and being on her feet for long peers of time at work.  No other concerns at this time.         Past Medical History:  Diagnosis Date   Anemia    Hemorrhoid     Patient Active Problem List   Diagnosis Date Noted   Elevated blood pressure reading without diagnosis of hypertension 11/10/2017   History of PID 09/01/2016   External hemorrhoid 08/05/2016   Heart murmur 11/12/1992    Past Surgical History:  Procedure Laterality Date   CESAREAN SECTION      Prior to Admission medications   Medication Sig Start Date End Date Taking? Authorizing Provider  meloxicam (MOBIC) 15 MG tablet Take 1 tablet (15 mg total) by mouth daily. 03/22/21 03/22/22  Faythe Ghee, PA-C    Allergies Other  Family History  Problem Relation Age of Onset   Diabetes Mother    Hypertension Mother     Social History Social History   Tobacco Use   Smoking status: Some Days    Packs/day: 0.25    Types: Cigarettes   Smokeless tobacco: Never  Vaping  Use   Vaping Use: Never used  Substance Use Topics   Alcohol use: No   Drug use: No    Review of Systems  Review of Systems  Constitutional:  Negative for chills and fever.  HENT:  Negative for sore throat.   Eyes:  Negative for pain.  Respiratory:  Negative for cough and stridor.   Cardiovascular:  Negative for chest pain.  Gastrointestinal:  Negative for vomiting.  Genitourinary:  Negative for dysuria.  Musculoskeletal:  Positive for myalgias (top of R foot).  Skin:  Negative for rash.  Neurological:  Negative for seizures, loss of consciousness and headaches.  Psychiatric/Behavioral:  Negative for suicidal ideas.   All other systems reviewed and are negative.    ____________________________________________   PHYSICAL EXAM:  VITAL SIGNS: ED Triage Vitals  Enc Vitals Group     BP 04/18/21 0907 (!) 131/102     Pulse Rate 04/18/21 0907 90     Resp 04/18/21 0907 18     Temp 04/18/21 0907 98.7 F (37.1 C)     Temp Source 04/18/21 0907 Oral     SpO2 04/18/21 0907 100 %     Weight 04/18/21 1021 125 lb (56.7 kg)     Height 04/18/21 1021 5' (1.524 m)     Head Circumference --  Peak Flow --      Pain Score 04/18/21 0840 8     Pain Loc --      Pain Edu? --      Excl. in GC? --    Vitals:   04/18/21 0907  BP: (!) 131/102  Pulse: 90  Resp: 18  Temp: 98.7 F (37.1 C)  SpO2: 100%   Physical Exam Vitals and nursing note reviewed.  Constitutional:      General: She is not in acute distress.    Appearance: She is well-developed.  HENT:     Head: Normocephalic and atraumatic.     Right Ear: External ear normal.     Left Ear: External ear normal.     Nose: Nose normal.  Eyes:     Conjunctiva/sclera: Conjunctivae normal.  Cardiovascular:     Rate and Rhythm: Normal rate and regular rhythm.     Heart sounds: No murmur heard. Pulmonary:     Effort: Pulmonary effort is normal. No respiratory distress.  Abdominal:     Palpations: Abdomen is soft.      Tenderness: There is no abdominal tenderness.  Musculoskeletal:     Cervical back: Neck supple.     Right lower leg: No edema.     Left lower leg: No edema.  Skin:    General: Skin is warm and dry.     Capillary Refill: Capillary refill takes less than 2 seconds.  Neurological:     Mental Status: She is alert and oriented to person, place, and time.  Psychiatric:        Mood and Affect: Mood normal.    2+ DP pulses.  Sensation intact to light touch throughout both lower extremities.  Patient is able to move all toes with symmetric strength and flex and extend at the heel.  There is no large effusion, induration, warmth or deformity but some mild tenderness over the dorsum without any fluctuance or other overlying skin changes.  Underside of foot and medial and lateral aspects are unremarkable. ____________________________________________   LABS (all labs ordered are listed, but only abnormal results are displayed)  Labs Reviewed - No data to display ____________________________________________  EKG  ____________________________________________  RADIOLOGY  ED MD interpretation: Plain for the foot is unremarkable for fracture dislocation.  Official radiology report(s): DG Foot Complete Right  Result Date: 04/18/2021 CLINICAL DATA:  Medial right foot pain EXAM: RIGHT FOOT COMPLETE - 3+ VIEW COMPARISON:  03/22/2021 FINDINGS: There is no evidence of fracture or dislocation. No cortical thickening or periosteal elevation. There is no evidence of arthropathy or other focal bone abnormality. Soft tissues are unremarkable. IMPRESSION: Negative. Electronically Signed   By: Duanne Guess D.O.   On: 04/18/2021 11:18    ____________________________________________   PROCEDURES  Procedure(s) performed (including Critical Care):  Procedures   ____________________________________________   INITIAL IMPRESSION / ASSESSMENT AND PLAN / ED COURSE      Patient presents with  above-stated history exam for 2 or 3 months of waxing and waning soreness on the top of her right foot there is a small bump.  She does not recall any injuries falls or insect bites.  She does wear closed shoes and spends a fair time on her feet.  Exam levels with mild tenderness over the dorsum of the small bump that does not seem very fluctuant and mildly tender without any surrounding skin changes.  This is less than 0.1 mm in diameter.  There are no findings on exam to suggest  recent trauma, septic joint, gout and I have a low suspicion for cellulitis or abscess.  Suspect possibly tendinitis versus ganglion cyst possibly inflamed from rubbing and tight shoes.  X-ray shows no bony abnormalities.  Given duration of symptoms I think patient is stable for discharge with close outpatient follow-up.  Advised wearing loose fitting shoes and advised that she may take Tylenol ibuprofen as needed.  Discharged stable condition.  Strict return precautions advised and discussed.     ____________________________________________   FINAL CLINICAL IMPRESSION(S) / ED DIAGNOSES  Final diagnoses:  Foot pain, right    Medications  acetaminophen (TYLENOL) tablet 1,000 mg (1,000 mg Oral Given 04/18/21 1044)  ibuprofen (ADVIL) tablet 400 mg (400 mg Oral Given 04/18/21 1044)     ED Discharge Orders     None        Note:  This document was prepared using Dragon voice recognition software and may include unintentional dictation errors.    Gilles Chiquito, MD 04/18/21 1122

## 2021-06-02 ENCOUNTER — Ambulatory Visit: Payer: Medicaid Other

## 2021-06-03 ENCOUNTER — Emergency Department
Admission: EM | Admit: 2021-06-03 | Discharge: 2021-06-03 | Disposition: A | Discharge status: Home / Self Care | Payer: Medicaid Other | Attending: Student in an Organized Health Care Education/Training Program | Admitting: Student in an Organized Health Care Education/Training Program

## 2021-06-03 ENCOUNTER — Other Ambulatory Visit: Payer: Self-pay

## 2021-06-03 ENCOUNTER — Emergency Department: Payer: Medicaid Other

## 2021-06-03 DIAGNOSIS — M7989 Other specified soft tissue disorders: Secondary | ICD-10-CM

## 2021-06-03 DIAGNOSIS — M79671 Pain in right foot: Secondary | ICD-10-CM | POA: Diagnosis not present

## 2021-06-03 DIAGNOSIS — Z5321 Procedure and treatment not carried out due to patient leaving prior to being seen by health care provider: Secondary | ICD-10-CM | POA: Insufficient documentation

## 2021-06-03 NOTE — ED Triage Notes (Signed)
Right foot pain for months. Reports has been xrayed X 2 with no explanation for foot pain. Pt alert and oriented X4, cooperative, RR even and unlabored, color WNL. Pt in NAD. No known injury

## 2021-06-03 NOTE — ED Provider Notes (Signed)
Emergency Medicine Provider Triage Evaluation Note  Andrea Huang , a 35 y.o. female  was evaluated in triage.  Pt complains of right foot pain and swelling.  States this is happened several times in the past.  No history of blood clot.  Denies any specific trauma.  Has pain worsened with walking.  Denies any fevers or chills..  Review of Systems  Positive: Foot pain Negative: fever  Physical Exam  Pulse (!) 103   Temp 98 F (36.7 C) (Oral)   Resp 16   Ht 5' (1.524 m)   Wt 56.7 kg   LMP 05/15/2021 (Approximate)   SpO2 100%   BMI 24.41 kg/m  Gen:   Awake, no distress   Resp:  Normal effort  MSK:   Moves extremities without difficulty pain of right mid foot, trace edema Other:    Medical Decision Making  Medically screening exam initiated at 4:28 PM.  Appropriate orders placed.  Andrea Huang was informed that the remainder of the evaluation will be completed by another provider, this initial triage assessment does not replace that evaluation, and the importance of remaining in the ED until their evaluation is complete.     Willy Eddy, MD 06/03/21 [phone]

## 2021-06-03 NOTE — ED Notes (Signed)
Pt left without seeing a provider.  

## 2021-09-04 ENCOUNTER — Other Ambulatory Visit: Payer: Self-pay

## 2021-09-04 ENCOUNTER — Ambulatory Visit (LOCAL_COMMUNITY_HEALTH_CENTER): Payer: Medicaid Other | Admitting: Advanced Practice Midwife

## 2021-09-04 VITALS — BP 133/87 | HR 89 | Temp 99.1°F | Resp 16 | Ht 62.0 in | Wt 121.2 lb

## 2021-09-04 DIAGNOSIS — Z3202 Encounter for pregnancy test, result negative: Secondary | ICD-10-CM | POA: Diagnosis not present

## 2021-09-04 DIAGNOSIS — Z30013 Encounter for initial prescription of injectable contraceptive: Secondary | ICD-10-CM

## 2021-09-04 DIAGNOSIS — F17200 Nicotine dependence, unspecified, uncomplicated: Secondary | ICD-10-CM

## 2021-09-04 DIAGNOSIS — Z3009 Encounter for other general counseling and advice on contraception: Secondary | ICD-10-CM | POA: Diagnosis not present

## 2021-09-04 DIAGNOSIS — Z720 Tobacco use: Secondary | ICD-10-CM | POA: Insufficient documentation

## 2021-09-04 DIAGNOSIS — Z113 Encounter for screening for infections with a predominantly sexual mode of transmission: Secondary | ICD-10-CM

## 2021-09-04 LAB — WET PREP FOR TRICH, YEAST, CLUE
Trichomonas Exam: NEGATIVE
Yeast Exam: NEGATIVE

## 2021-09-04 LAB — PREGNANCY, URINE: Preg Test, Ur: NEGATIVE

## 2021-09-04 MED ORDER — METRONIDAZOLE 500 MG PO TABS: 500.0000 mg | ORAL_TABLET | Freq: Two times a day (BID) | ORAL | 0 refills | Status: AC

## 2021-09-04 MED ORDER — MEDROXYPROGESTERONE ACETATE 150 MG/ML IM SUSP
150.0000 mg | Freq: Once | INTRAMUSCULAR | Status: AC
  Administered 2021-09-04: 150 mg via INTRAMUSCULAR

## 2021-09-04 NOTE — Progress Notes (Signed)
WH problem visit  [facility]  Subjective:  Andrea Huang is a 36 y.o.SBF vaper G2P2 (13,14) being seen today for STD check and DMPA initiation. C/o breast tenderness since 07/2021. LMP 08/10/21 (abnormal). Last sex  08/24/21 without condom; with current partner x 10 years; 1 partner in last 3 mo. Working 45 hours/wk. Living with female cousin and his brother and his girlfriend, pt's 2 kids.  Was trying to conceive but "gave up and want DMPA now". Last MJ age 50. Last cig 2021. Current vaper. Last ETOH 08/30/21 (1 glass wine) 1x/mo. Last pap 09/10/20 neg HPV neg. Last physical 09/10/20  Chief Complaint  Patient presents with   SEXUALLY TRANSMITTED DISEASE   Contraception    HPI   Does the patient have a current or past history of drug use? No   No components found for: HCV]   Health Maintenance Due  Topic Date Due   COVID-19 Vaccine (1) Never done   Pneumococcal Vaccine 60-17 Years old (1 - PCV) Never done   Hepatitis C Screening  Never done   TETANUS/TDAP  Never done   INFLUENZA VACCINE  Never done    ROS  The following portions of the patient's history were reviewed and updated as appropriate: allergies, current medications, past family history, past medical history, past social history, past surgical history and problem list. Problem list updated.   See flowsheet for other program required questions.  Objective:   Vitals:   09/04/21 1507  BP: 133/87  Pulse: 89  Resp: 16  Temp: 99.1 F (37.3 C)  TempSrc: Oral  Weight: 121 lb 3.2 oz (55 kg)  Height: 5\' 2"  (1.575 m)    Physical Exam Vitals and nursing note reviewed.  Constitutional:      Appearance: Normal appearance. She is normal weight.  HENT:     Head: Normocephalic.  Eyes:     Conjunctiva/sclera: Conjunctivae normal.  Pulmonary:     Effort: Pulmonary effort is normal.  Abdominal:     Palpations: Abdomen is soft.     Comments: Soft, poor tone, without  tenderness, enlarged uterus ~12 wks size  Genitourinary:    Exam position: Lithotomy position.     Vagina: Vaginal discharge (grey thin leukorrhea, ph>4.5, sl malodor) present.     Cervix: Normal.     Uterus: Enlarged (enlarged 12 wk size).      Adnexa: Right adnexa normal and left adnexa normal.     Rectum: Normal.  Musculoskeletal:        General: Normal range of motion.  Lymphadenopathy:     Cervical:     Right cervical: No superficial, deep or posterior cervical adenopathy.    Left cervical: No superficial, deep or posterior cervical adenopathy.  Skin:    General: Skin is warm and dry.  Neurological:     Mental Status: She is alert. Mental status is at baseline.      Assessment and Plan:  Andrea Huang is a 36 y.o. female presenting to the [facility] Department for a Women's Health problem visit  1. Encounter for initial prescription of injectable contraceptive Pt desires DMPA even with possible chance she could be pregnant and too early to pick up on PT today If PT neg today pt desires DMPA. After PT neg today, pt changes mind and does not want DMPA today but plans abstinance and will come in 09/11/21 for PE and PT and if PT neg that day then wants  DMPA. Pt now changes her mind once again and states she wants DMPA today. Please counsel on need for repeat PT 09/07/21 and call us if + Please counsel on need for abstinance next 7 days  2. Screening examination for venereal disease Treat wet mount per standing orders; treat for BV today Immunization nurse consult Please counsel pt she needs to repeat PT 09/07/21 and call if + Please inform pt she needs yearly physical 09/10/21 and will need PT that day. If PT neg then will need u/s to r/o fibroids Pt changes mind and does not want HIV or syphillis today - WET PREP FOR TRICH, YEAST, CLUE - Pregnancy, urine - Chlamydia/Gonorrhea [facility] - Syphilis Serology, [facility] - HIV [facility]  3. Smoker Last  cig 2021  4. Vapes nicotine containing substance Currently--counseled via 5 A's to stop      No follow-ups on file.  No future appointments.  Alberteen Spindle, CNM

## 2021-09-04 NOTE — Progress Notes (Signed)
Patient is here for acute FP visit (wanting to start depo) and to get tested for STD.   Last annual physical on 09/10/20.  LMP: 08/10/21 Last sex: 08/24/21 (no condoms)   Patient declined blood work for HIV and syphilis. Sent to lab for gonorrhea and chlamydia. Wet prep reviewed during clinic visit - treatment given for BV.  Mitronidazole 14 tablets dispensed to take for 7 days BID. Education given.   Pregnancy test negative during visit. Pregnancy test to be repeated on 09/11/21 per E. Sciora, CNM.   Depo injection given today one time order per E. Sciora, CNM. Injection given right deltoid and tolerated well. Patient reminder card given to next depo due 11/20/21. Depo consent signed today.   Annual physical scheduled for 09/11/21 at 2:20 arrival time.   All questions answered.   Adalberto Cole, RN

## 2021-09-11 ENCOUNTER — Ambulatory Visit: Payer: Medicaid Other

## 2021-09-18 ENCOUNTER — Other Ambulatory Visit: Payer: Self-pay

## 2021-09-18 ENCOUNTER — Emergency Department
Admission: EM | Admit: 2021-09-18 | Discharge: 2021-09-18 | Disposition: A | Discharge status: Home / Self Care | Payer: Medicaid Other | Attending: Emergency Medicine | Admitting: Emergency Medicine

## 2021-09-18 DIAGNOSIS — K8500 Idiopathic acute pancreatitis without necrosis or infection: Secondary | ICD-10-CM | POA: Insufficient documentation

## 2021-09-18 DIAGNOSIS — R1033 Periumbilical pain: Secondary | ICD-10-CM | POA: Diagnosis present

## 2021-09-18 LAB — CBC
HCT: 32.6 % — ABNORMAL LOW (ref 36.0–46.0)
Hemoglobin: 11 g/dL — ABNORMAL LOW (ref 12.0–15.0)
MCH: 27.2 pg (ref 26.0–34.0)
MCHC: 33.7 g/dL (ref 30.0–36.0)
MCV: 80.7 fL (ref 80.0–100.0)
Platelets: 255 10*3/uL (ref 150–400)
RBC: 4.04 MIL/uL (ref 3.87–5.11)
RDW: 14.9 % (ref 11.5–15.5)
WBC: 3.7 10*3/uL — ABNORMAL LOW (ref 4.0–10.5)
nRBC: 0 % (ref 0.0–0.2)

## 2021-09-18 LAB — URINALYSIS, ROUTINE W REFLEX MICROSCOPIC
Bilirubin Urine: NEGATIVE
Glucose, UA: NEGATIVE mg/dL
Ketones, ur: NEGATIVE mg/dL
Nitrite: NEGATIVE
Protein, ur: NEGATIVE mg/dL
Specific Gravity, Urine: 1.006 (ref 1.005–1.030)
pH: 7 (ref 5.0–8.0)

## 2021-09-18 LAB — COMPREHENSIVE METABOLIC PANEL
ALT: 18 U/L (ref 0–44)
AST: 21 U/L (ref 15–41)
Albumin: 3.9 g/dL (ref 3.5–5.0)
Alkaline Phosphatase: 70 U/L (ref 38–126)
Anion gap: 7 (ref 5–15)
BUN: 10 mg/dL (ref 6–20)
CO2: 23 mmol/L (ref 22–32)
Calcium: 8.9 mg/dL (ref 8.9–10.3)
Chloride: 107 mmol/L (ref 98–111)
Creatinine, Ser: 0.6 mg/dL (ref 0.44–1.00)
GFR, Estimated: >60 mL/min (ref >60)
Glucose, Bld: 97 mg/dL (ref 70–99)
Potassium: 3.7 mmol/L (ref 3.5–5.1)
Sodium: 137 mmol/L (ref 135–145)
Total Bilirubin: 0.4 mg/dL (ref 0.3–1.2)
Total Protein: 8.2 g/dL — ABNORMAL HIGH (ref 6.5–8.1)

## 2021-09-18 LAB — POC URINE PREG, ED: Preg Test, Ur: NEGATIVE

## 2021-09-18 LAB — LIPASE, BLOOD: Lipase: 129 U/L — ABNORMAL HIGH (ref 11–51)

## 2021-09-18 MED ORDER — ONDANSETRON 4 MG PO TBDP: 4.0000 mg | ORAL_TABLET | Freq: Three times a day (TID) | ORAL | 0 refills | Status: DC | PRN

## 2021-09-18 MED ORDER — KETOROLAC TROMETHAMINE 30 MG/ML IJ SOLN
15.0000 mg | Freq: Once | INTRAMUSCULAR | Status: AC
  Administered 2021-09-18: 15 mg via INTRAVENOUS
  Filled 2021-09-18: qty 1

## 2021-09-18 MED ORDER — OXYCODONE HCL 5 MG PO TABS: 5.0000 mg | ORAL_TABLET | Freq: Three times a day (TID) | ORAL | 0 refills | Status: AC | PRN

## 2021-09-18 NOTE — ED Triage Notes (Signed)
Pt c/o abd pain and breast pain, states she was seen by PCP 1/5 and told her uterus enlarged and told to come the ED thien.

## 2021-09-18 NOTE — Discharge Instructions (Addendum)
As we discussed, you have signs of pancreatitis, or inflammation of your pancreas, contributing to your abdominal pain.  Please restrain from eating large meals with solid food, see attached eating plan.  Control nausea and pain, and stay hydrated.  I have also attached the phone number and information for local OB/GYN to discuss your concerns for enlarged uterus.  If you develop any severely worsening abdominal pain, please return to the ED.  Please take Tylenol and ibuprofen/Advil for your pain.  It is safe to take them together, or to alternate them every few hours.  Take up to 1000mg  of Tylenol at a time, up to 4 times per day.  Do not take more than 4000 mg of Tylenol in 24 hours.  For ibuprofen, take 400-600 mg, 4-5 times per day.  Use Zofran as needed for nausea and vomiting.  Use oxycodone as needed for more severe pain.

## 2021-09-18 NOTE — ED Notes (Signed)
Pt given crackers and pb

## 2021-09-18 NOTE — ED Provider Notes (Signed)
[facility] Provider Note    Event Date/Time   First MD Initiated Contact with Patient 09/18/21 0818     (approximate)   History   Abdominal Pain   HPI  Andrea Huang is a 36 y.o. female who presents to the ED for evaluation of Abdominal Pain   I review outpatient OB/GYN visit from 1/5 for patient saw a midwife to initiate Depo-Provera.  She was provided this injection then, as well as initiated treatment for BV with Flagyl twice daily. Otherwise, she is a G2 P2 without much medical history..  Patient presents to the ED for evaluation of subacute abdominal discomfort and breast soreness.  She reports finishing her treatment for BV, and has had no further vaginal discharge.  Reports a couple days of spotting after her Depo shot, and none since then.  Regarding her breast discomfort, she reports bilateral and diffuse aching.  Denies any discharge from the nipple, bleeding or lumps/bumps.  She reports she does check them regularly in the shower.  No changes.  She reports 2 weeks of lower abdominal and midline periumbilical abdominal discomfort with associated nausea.  Denies RUQ tenderness, history of gallstones.  Reports drinking about 1 glass of wine every other day without binge drinking.  No history of pancreatitis.  She was told by the midwife at OB/GYN that her uterus might be somewhat large and was told to come to the ED to get an emergent ultrasound for this 2 weeks ago.  Due to her abdominal discomfort and nausea, she presents today to the ED for evaluation.    Physical Exam   Triage Vital Signs: ED Triage Vitals [09/18/21 0820]  Enc Vitals Group     BP (!) 171/122     Pulse Rate 91     Resp 16     Temp 98.5 F (36.9 C)     Temp Source Oral     SpO2 100 %     Weight      Height      Head Circumference      Peak Flow      Pain Score 5     Pain Loc      Pain Edu?      Excl. in GC?     Most recent vital signs: Vitals:   09/18/21  0820  BP: (!) 171/122  Pulse: 91  Resp: 16  Temp: 98.5 F (36.9 C)  SpO2: 100%    General: Awake, no distress.  Pleasant and conversational in full sentences. CV:  Good peripheral perfusion.  Resp:  Normal effort.  Abd:  No distention.  Very mild periumbilical tenderness to palpation without guarding or peritoneal features.  Lower abdomen is benign bilaterally and no RUQ tenderness or upper tenderness.  No palpable masses or fullness to the lower abdomen. MSK:  No deformity noted.  Neuro:  No focal deficits appreciated. Other:  External breast exam with no skin changes, nodularity or masses appreciated.  No discharge from nipples bilaterally.   ED Results / Procedures / Treatments   Labs (all labs ordered are listed, but only abnormal results are displayed) Labs Reviewed  LIPASE, BLOOD - Abnormal; Notable for the following components:      Result Value   Lipase 129 (*)    All other components within normal limits  COMPREHENSIVE METABOLIC PANEL - Abnormal; Notable for the following components:   Total Protein 8.2 (*)    All other components within normal limits  CBC -  Abnormal; Notable for the following components:   WBC 3.7 (*)    Hemoglobin 11.0 (*)    HCT 32.6 (*)    All other components within normal limits  URINALYSIS, ROUTINE W REFLEX MICROSCOPIC - Abnormal; Notable for the following components:   Color, Urine YELLOW (*)    APPearance HAZY (*)    Hgb urine dipstick LARGE (*)    Leukocytes,Ua TRACE (*)    Bacteria, UA FEW (*)    All other components within normal limits  POC URINE PREG, ED    EKG    RADIOLOGY   Official radiology report(s): No results found.  PROCEDURES and INTERVENTIONS:  Procedures  Medications  ketorolac (TORADOL) 30 MG/ML injection 15 mg (15 mg Intravenous Given 09/18/21 1011)     IMPRESSION / MDM / ASSESSMENT AND PLAN / ED COURSE  I reviewed the triage vital signs and the nursing notes.  Healthy 36 year old female presents to  the ED with subacute abdominal discomfort, with evidence of mild idiopathic pancreatitis suitable for outpatient management.  She looks clinically well to me and has a benign examination.  No significant abdominal tenderness, guarding or peritoneal features.  Blood work with slight elevation of lipase to the 120s and is otherwise reassuring.  No leukocytosis or metabolic derangements.  Urine without infectious features considering her lack of symptoms.  She is tolerating p.o. intake without severe signs of dehydration poorly controlled pain.  Will discharge with symptomatic measures. She is requesting an ultrasound of her uterus because she thinks it might be large.  I see no emergent indications for the study and we will refer to OB/GYN.  We discussed return precautions for the ED.      FINAL CLINICAL IMPRESSION(S) / ED DIAGNOSES   Final diagnoses:  Idiopathic acute pancreatitis without infection or necrosis     Rx / DC Orders   ED Discharge Orders          Ordered    ondansetron (ZOFRAN-ODT) 4 MG disintegrating tablet  Every 8 hours PRN        09/18/21 1007    oxyCODONE (ROXICODONE) 5 MG immediate release tablet  Every 8 hours PRN        09/18/21 1007             Note:  This document was prepared using Dragon voice recognition software and may include unintentional dictation errors.   Delton Prairie, MD 09/18/21 1013

## 2021-10-29 ENCOUNTER — Ambulatory Visit: Payer: Medicaid Other

## 2021-11-04 ENCOUNTER — Encounter: Payer: Medicaid Other | Admitting: Obstetrics and Gynecology

## 2021-11-04 DIAGNOSIS — Z01419 Encounter for gynecological examination (general) (routine) without abnormal findings: Secondary | ICD-10-CM

## 2021-11-25 ENCOUNTER — Ambulatory Visit: Payer: Medicaid Other

## 2021-11-27 ENCOUNTER — Ambulatory Visit: Payer: Medicaid Other

## 2021-12-15 ENCOUNTER — Ambulatory Visit: Payer: Medicaid Other

## 2022-01-12 IMAGING — US US EXTREM LOW VENOUS*R*
1 series · 14 of 24 positions shown · non-contrast
Comparison: None

CLINICAL DATA: RIGHT leg swelling

EXAM:
RIGHT LOWER EXTREMITY VENOUS DOPPLER ULTRASOUND
TECHNIQUE: Gray-scale sonography with compression, as well as color and duplex
ultrasound, were performed to evaluate the deep venous system(s)
from the level of the common femoral vein through the popliteal and
proximal calf veins.

[Series 1: us extrem low venous*right* · 0.07mm/px · 14 of 30 slices shown]
[im 1/30]
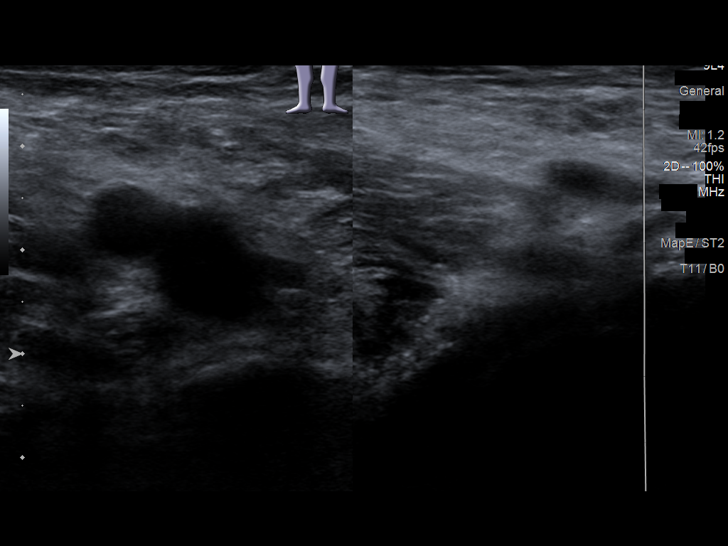
[im 3/30]
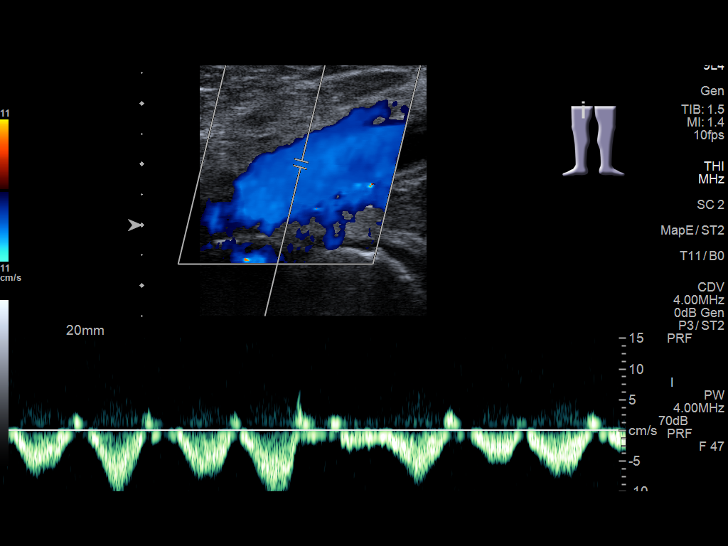
[im 6/30]
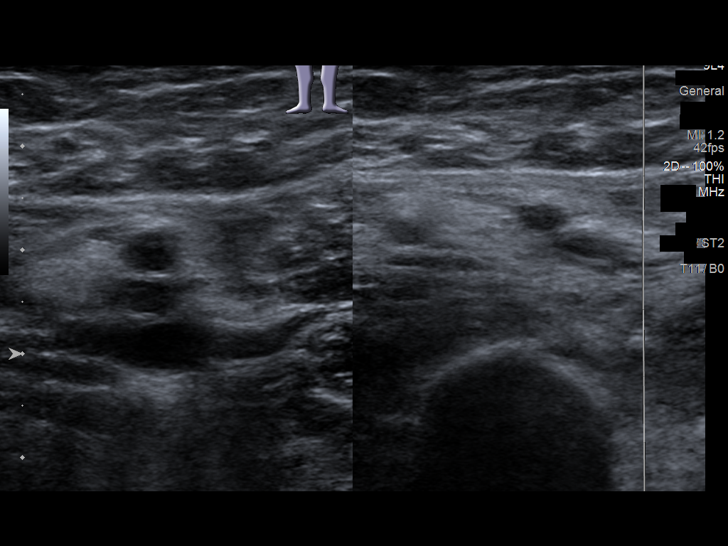
[im 8/30]
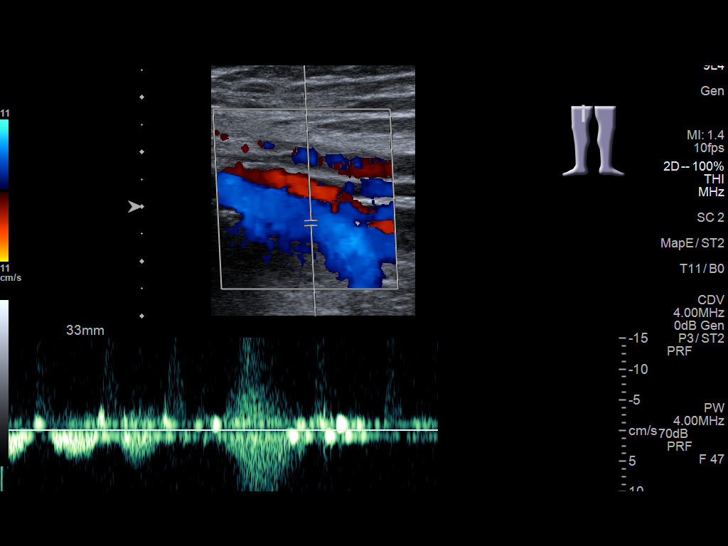
[im 9/30]
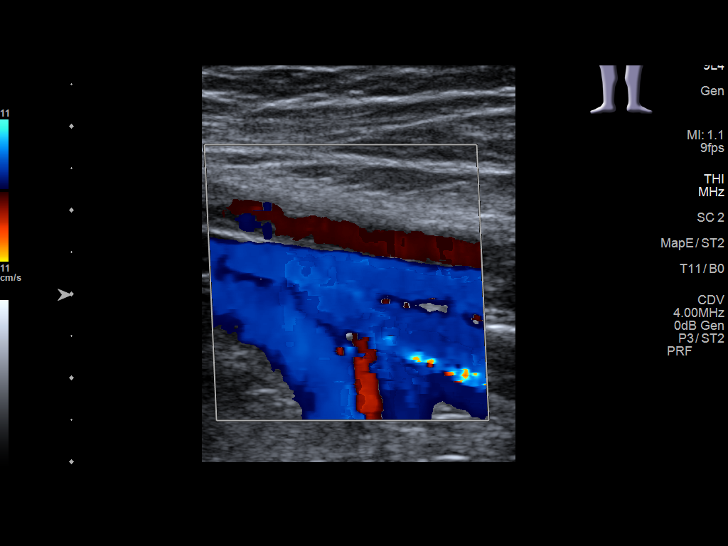
[im 12/30]
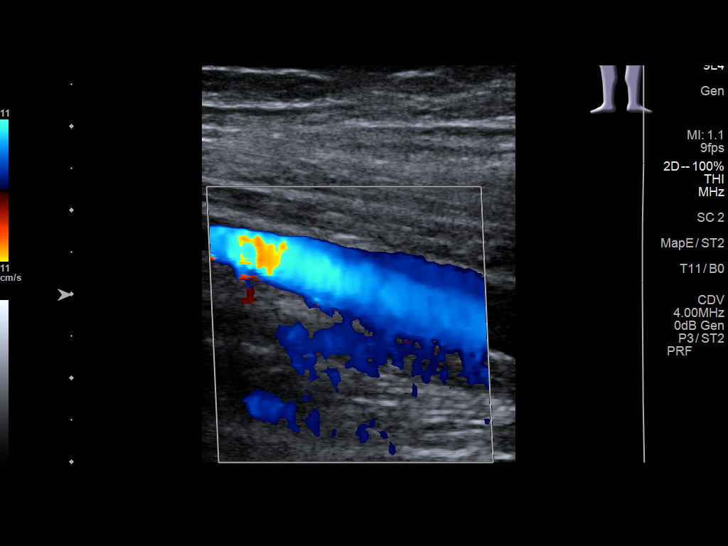
[im 14/30]
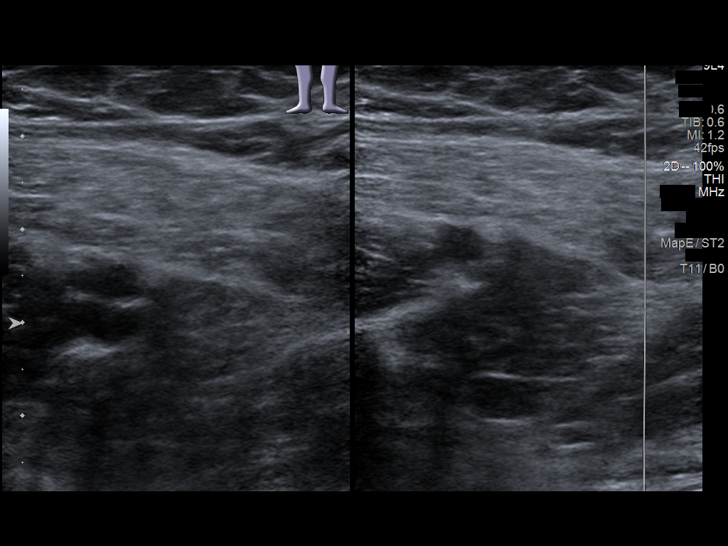
[im 16/30]
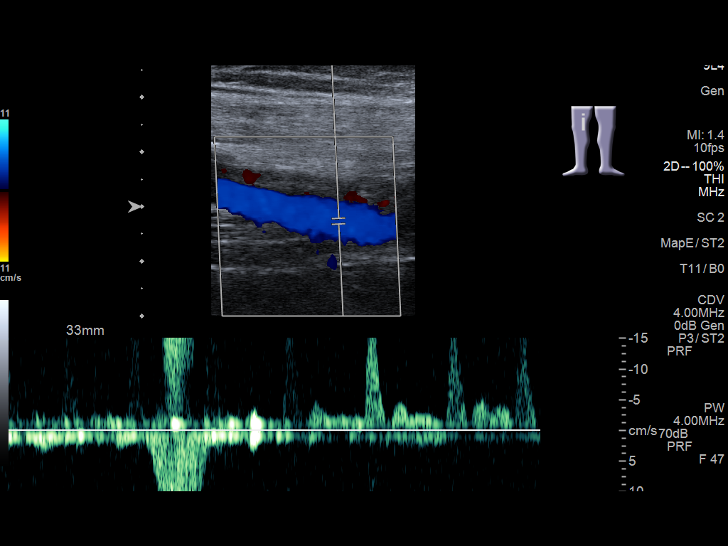
[im 18/30]
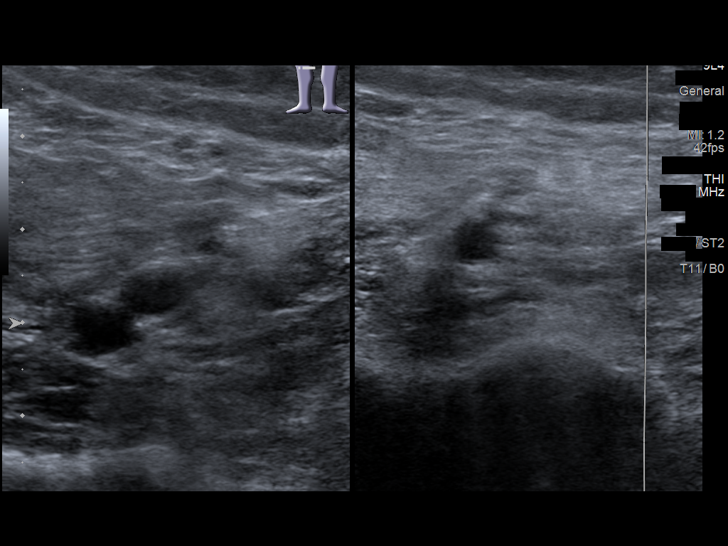
[im 21/30]
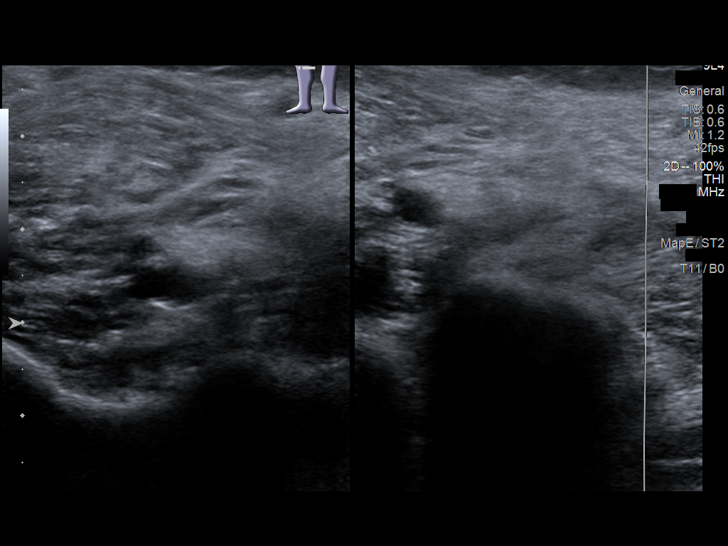
[im 23/30]
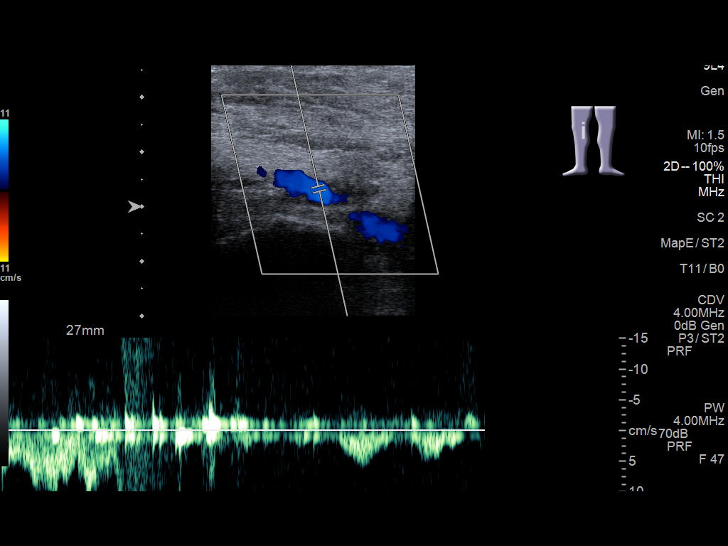
[im 24/30]
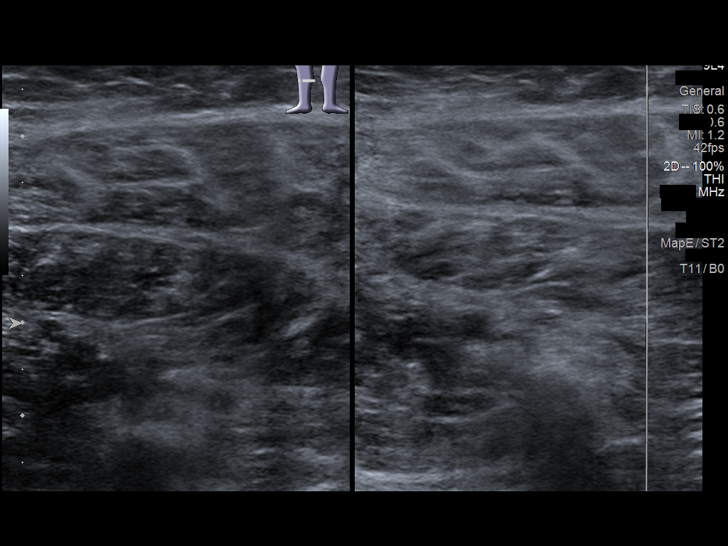
[im 27/30]
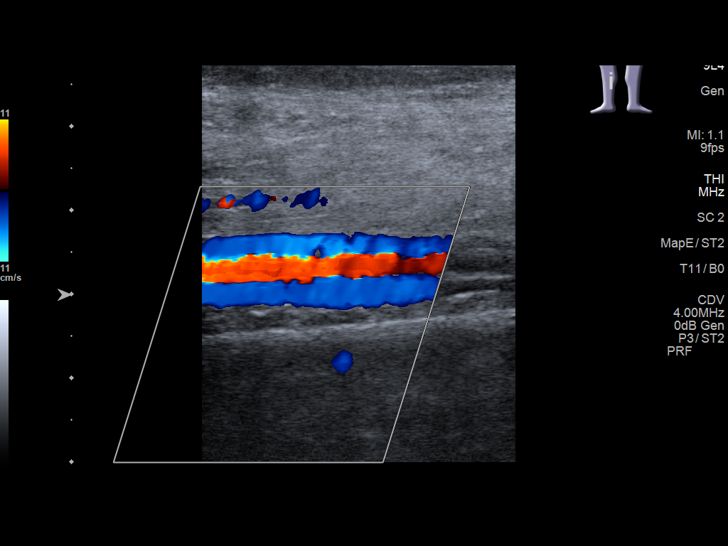
[im 30/30]
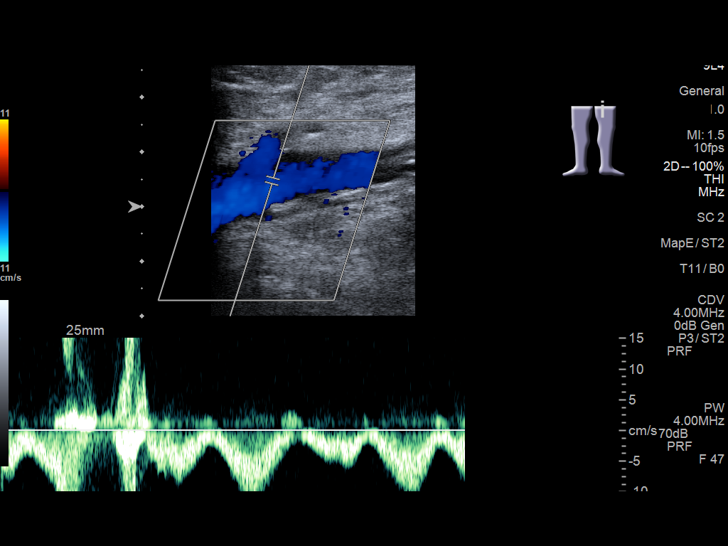

[14 of 24 positions shown; findings below may reference images not displayed]

FINDINGS: VENOUS

Normal compressibility of the common femoral, superficial femoral,
and popliteal veins, as well as the visualized calf veins.
Visualized portions of profunda femoral vein and great saphenous
vein unremarkable. No filling defects to suggest DVT on grayscale or
color Doppler imaging. Doppler waveforms show normal direction of
venous flow, normal respiratory plasticity and response to
augmentation.

Limited views of the contralateral common femoral vein are
unremarkable.

OTHER

None.

Limitations: none
IMPRESSION: No evidence of deep venous thrombosis in the RIGHT lower extremity.

## 2022-01-12 IMAGING — CR DG FOOT COMPLETE 3+V*R*
3 series · 3 of 3 positions shown · non-contrast
Comparison: X-ray right foot 04/18/2021

CLINICAL DATA: right foot pain, eval for fractur

EXAM:
RIGHT FOOT COMPLETE - 3+ VIEW

[foot ap]
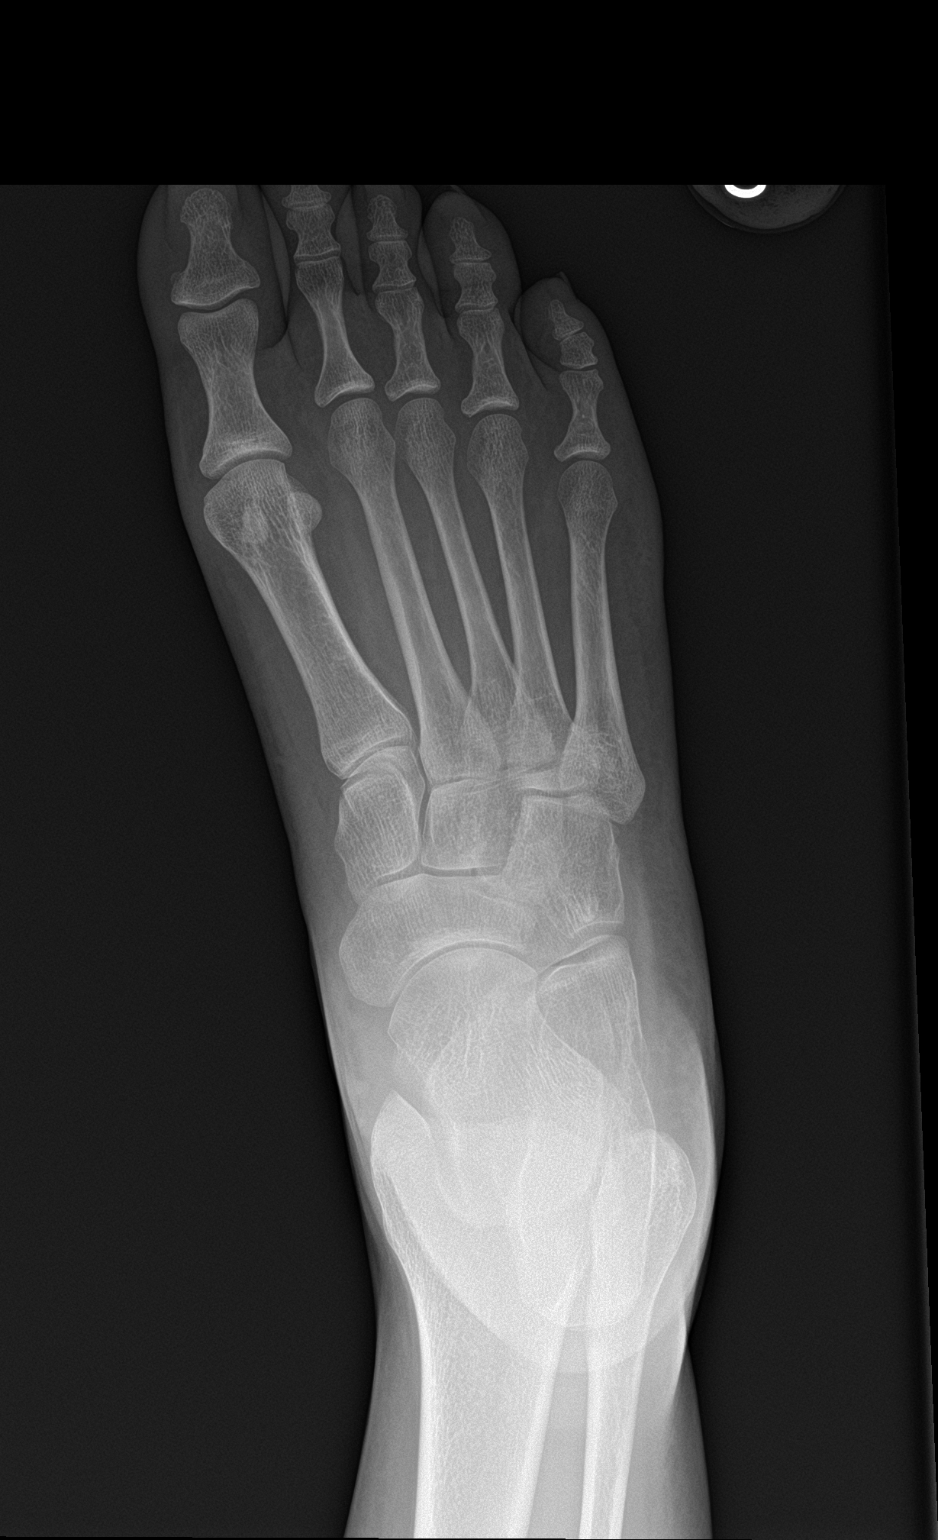

[foot obl]
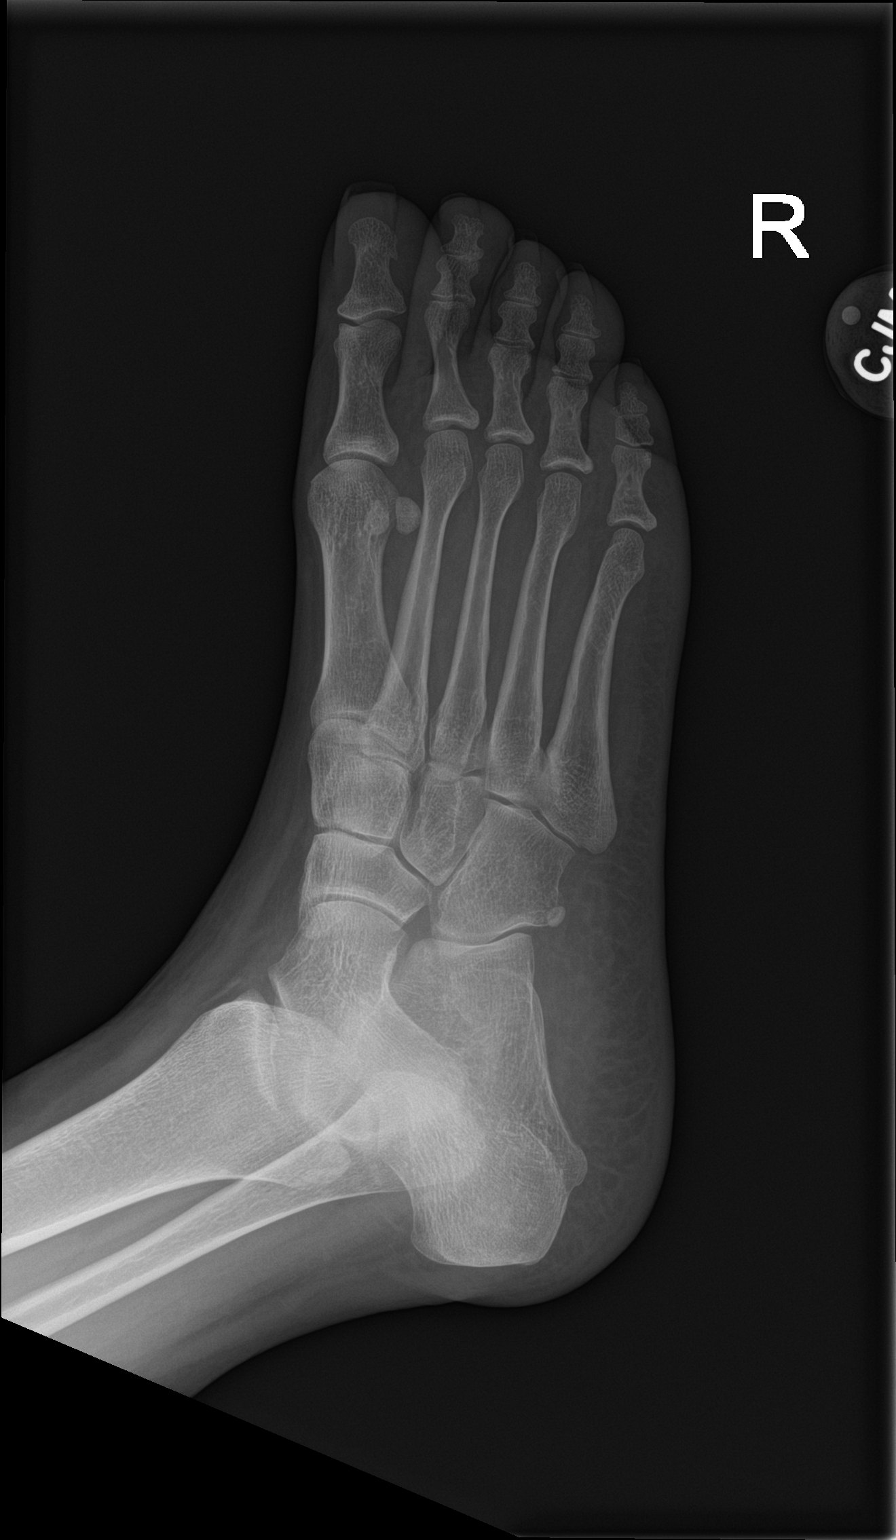

[foot lat]
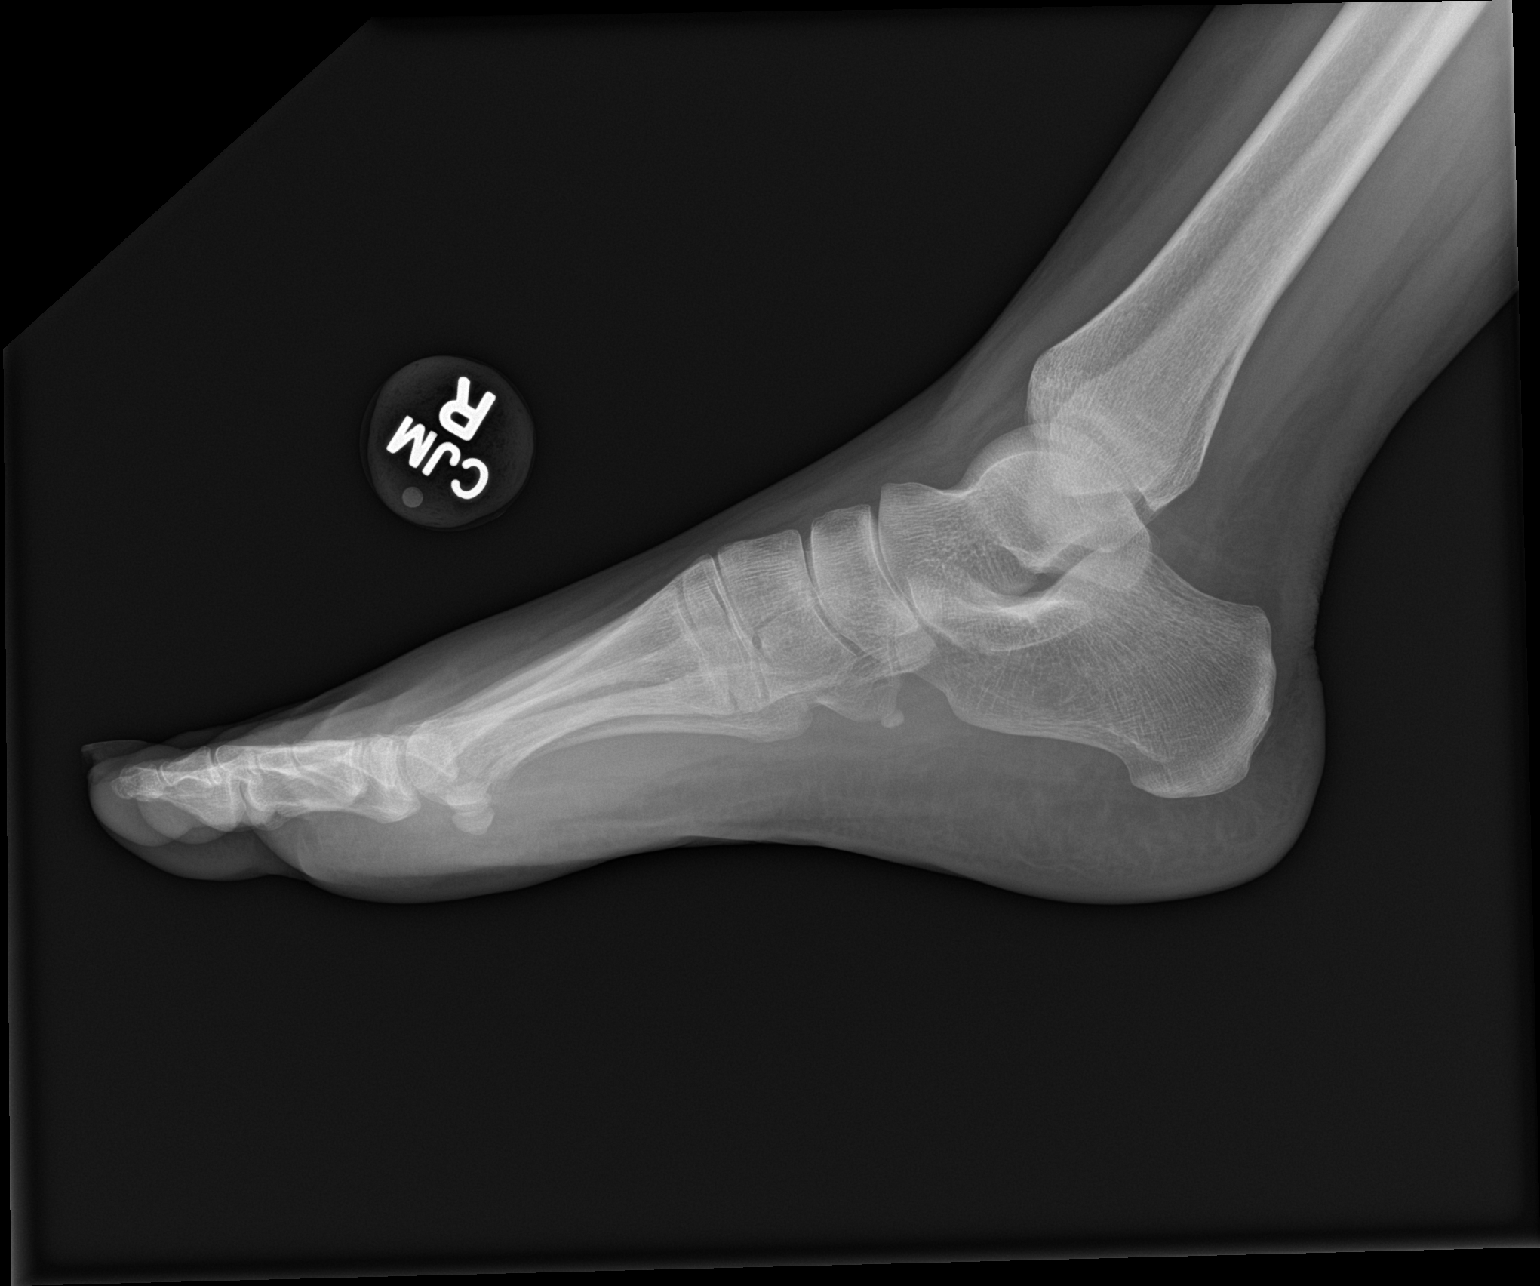

[3 of 3 positions shown; findings below may reference images not displayed]

FINDINGS: No evidence of fracture, dislocation, or joint effusion. No evidence
of severe arthropathy. No aggressive appearing focal bone
abnormality. Soft tissues are unremarkable.
IMPRESSION: No evidence of fracture, dislocation, or joint effusion. No evidence
of severe arthropathy. No aggressive appearing focal bone
abnormality. Soft tissues are unremarkable.

## 2022-03-04 ENCOUNTER — Ambulatory Visit: Payer: Medicaid Other

## 2022-03-11 ENCOUNTER — Emergency Department: Payer: Medicaid Other

## 2022-03-11 ENCOUNTER — Other Ambulatory Visit: Payer: Self-pay

## 2022-03-11 ENCOUNTER — Emergency Department
Admission: EM | Admit: 2022-03-11 | Discharge: 2022-03-11 | Disposition: A | Discharge status: Home / Self Care | Payer: Medicaid Other | Attending: Emergency Medicine | Admitting: Emergency Medicine

## 2022-03-11 DIAGNOSIS — N644 Mastodynia: Secondary | ICD-10-CM | POA: Diagnosis not present

## 2022-03-11 DIAGNOSIS — R000 Tachycardia, unspecified: Secondary | ICD-10-CM | POA: Diagnosis not present

## 2022-03-11 DIAGNOSIS — R079 Chest pain, unspecified: Secondary | ICD-10-CM | POA: Diagnosis present

## 2022-03-11 LAB — URINALYSIS, ROUTINE W REFLEX MICROSCOPIC
Bilirubin Urine: NEGATIVE
Glucose, UA: NEGATIVE mg/dL
Hgb urine dipstick: NEGATIVE
Ketones, ur: NEGATIVE mg/dL
Leukocytes,Ua: NEGATIVE
Nitrite: NEGATIVE
Protein, ur: NEGATIVE mg/dL
Specific Gravity, Urine: 1.023 (ref 1.005–1.030)
pH: 5 (ref 5.0–8.0)

## 2022-03-11 LAB — CBC
HCT: 34.4 % — ABNORMAL LOW (ref 36.0–46.0)
Hemoglobin: 10.9 g/dL — ABNORMAL LOW (ref 12.0–15.0)
MCH: 26.7 pg (ref 26.0–34.0)
MCHC: 31.7 g/dL (ref 30.0–36.0)
MCV: 84.3 fL (ref 80.0–100.0)
Platelets: 269 10*3/uL (ref 150–400)
RBC: 4.08 MIL/uL (ref 3.87–5.11)
RDW: 14.5 % (ref 11.5–15.5)
WBC: 3.7 10*3/uL — ABNORMAL LOW (ref 4.0–10.5)
nRBC: 0 % (ref 0.0–0.2)

## 2022-03-11 LAB — BASIC METABOLIC PANEL
Anion gap: 6 (ref 5–15)
BUN: 10 mg/dL (ref 6–20)
CO2: 26 mmol/L (ref 22–32)
Calcium: 9.2 mg/dL (ref 8.9–10.3)
Chloride: 107 mmol/L (ref 98–111)
Creatinine, Ser: 0.66 mg/dL (ref 0.44–1.00)
GFR, Estimated: >60 mL/min (ref >60)
Glucose, Bld: 96 mg/dL (ref 70–99)
Potassium: 3.8 mmol/L (ref 3.5–5.1)
Sodium: 139 mmol/L (ref 135–145)

## 2022-03-11 LAB — TROPONIN I (HIGH SENSITIVITY): Troponin I (High Sensitivity): 11 ng/L (ref <18)

## 2022-03-11 LAB — POC URINE PREG, ED: Preg Test, Ur: NEGATIVE

## 2022-03-11 LAB — HCG, QUANTITATIVE, PREGNANCY: hCG, Beta Chain, Quant, S: <1 m[IU]/mL (ref <5)

## 2022-03-11 MED ORDER — IBUPROFEN 400 MG PO TABS
400.0000 mg | ORAL_TABLET | Freq: Once | ORAL | Status: AC
  Administered 2022-03-11: 400 mg via ORAL
  Filled 2022-03-11: qty 1

## 2022-03-11 NOTE — ED Notes (Signed)
Dc ppw provided. Followup and RX information reviewed as applicable. pt declines VS at this time and provides verbal consent for dc at this time. Pt alert and oriented to lobby. 

## 2022-03-11 NOTE — Discharge Instructions (Addendum)
Your breast may be tender due to hormonal changes.  If this continues please follow-up with your primary care doctor.  You can take 400 mg of ibuprofen every 6 hours for pain.

## 2022-03-11 NOTE — ED Triage Notes (Signed)
Pt here with cp that started earlier today. Pt states the pain is centered and does not radiate. Pt believes it is not her heart but possibly a muscle. Pt states her period is missed by a week.

## 2022-03-11 NOTE — ED Provider Notes (Signed)
[facility] Provider Note    Event Date/Time   First MD Initiated Contact with Patient 03/11/22 1824     (approximate)   History   Chest Pain   HPI  Andrea Huang is a 36 y.o. female with past medical history of anemia who presents with breast pain.  Symptoms of been going on for about 2 weeks.  She endorses pain in the bilateral breasts and is sensitive to touch and with movement.  She denies shortness of breath is not pleuritic.  Feels like the nipples are quite sensitive to.  Denies history of similar.  LMP was in May she did not have a period in June, periods are not regular in general.  She is also endorses some lower abdominal bloating but no frank pain denies constipation or dysuria.The patient denies hx of prior DVT/PE, unilateral leg pain/swelling, hormone use, recent surgery, hx of cancer, prolonged immobilization, or hemoptysis.       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 --      Excl. in GC? --     Most recent vital signs: Vitals:   03/11/22 1627 03/11/22 1850  BP: (!) 142/105   Pulse:  83  Resp:    Temp:    SpO2:       General: Awake, no  distress.  CV:  Good peripheral perfusion.  No peripheral edema or asymmetry Resp:  Normal effort.  Lungs are clear Abd:  No distention.  No tenderness throughout Neuro:             Awake, Alert, Oriented x 3  Other:     ED Results / Procedures / Treatments  Labs (all labs ordered are listed, but only abnormal results are displayed) Labs Reviewed  CBC - Abnormal; Notable for the following components:      Result Value   WBC 3.7 (*)    Hemoglobin 10.9 (*)    HCT 34.4 (*)    All other components within normal limits  URINALYSIS, ROUTINE W REFLEX MICROSCOPIC - Abnormal; Notable for the following components:   Color, Urine YELLOW (*)    APPearance HAZY (*)    All other components within normal limits  BASIC METABOLIC PANEL  HCG, QUANTITATIVE, PREGNANCY  POC URINE PREG, ED  TROPONIN I (HIGH SENSITIVITY)     EKG  EKG interpretation performed by myself: sinus tachycardia,  right axis, nml intervals, no acute ischemic changes    RADIOLOGY I reviewed and interpreted the CXR , possible R sided ataletcatsis      PROCEDURES:  Critical Care performed: No  Procedures   MEDICATIONS ORDERED IN ED: Medications  ibuprofen (ADVIL) tablet 400 mg (400 mg Oral Given 03/11/22 1912)     IMPRESSION / MDM / ASSESSMENT AND PLAN / ED COURSE  I reviewed the triage vital signs and the nursing notes.                              Patient's presentation is most consistent with acute complicated illness / injury requiring diagnostic workup.  Differential diagnosis includes, but is not limited to, fibrocystic change, mastitis, malignancy, hormonal related breast pain pericarditis, carditis, PE  Patient is a 36 year old female presenting with breast pain x 2 weeks.  Triage complaint is chest pain however she is really having tenderness of bilateral breast that is reproducible on palpation and when she touches them as well as nipple soreness.  Has not had nipple discharge in his bilateral no  masses.  She has had similar pain in the past I see from an ED visit 6 months ago.  She is not having any exertional pleuritic symptoms she is not short of breath.  Initial heart rate is in the 110s but this resolved without intervention and HR remained in the 80s.  She is satting 98% on room air EKG does have a right axis deviation shows sinus tachycardia without ischemic changes.  Troponin is 11.  Given symptoms of been going on for 2 weeks and I have very low suspicion for acute coronary syndrome based on symptomatology do not feel the need to repeat.  Breast physical exam is unremarkable other than some mild tenderness.  No signs of infection or masses.  Suspect that this may be hormonal related as patient missed her period last month and also has associated bloating.  Pregnancy test is negative here.  CBC and BMP are largely reassuring.  Advised patient to follow-up with both PCP and GYN.  Recommended NSAIDs.  She is appropriate for discharge at this time.       FINAL CLINICAL IMPRESSION(S) / ED DIAGNOSES   Final diagnoses:  Soreness breast     Rx / DC Orders   ED Discharge Orders     None        Note:  This document was prepared using Dragon voice recognition software and may include unintentional dictation errors.   Georga Hacking, MD 03/12/22 [phone]

## 2022-03-11 NOTE — ED Provider Triage Note (Signed)
Emergency Medicine Provider Triage Evaluation Note  Andrea Huang , a 36 y.o. female  was evaluated in triage.  Pt complains of "my boobs are so sore it is making my chest hurt." Low abdominal pain. LMP May. On depo. Nausea and vomiting. Reports SOB when laying down  Review of Systems  Positive: Abd pain, tender breast, nausea Negative: SOB  Physical Exam  BP (!) 142/105   Pulse (!) 114   Temp 98.7 F (37.1 C) (Oral)   Resp 16   Ht 5' (1.524 m)   Wt 56.7 kg   LMP 01/12/2022 (Exact Date)   SpO2 98%   BMI 24.41 kg/m  Gen:   Awake, no distress   Resp:  Normal effort  MSK:   Moves extremities without difficulty  Other:    Medical Decision Making  Medically screening exam initiated at 4:28 PM.  Appropriate orders placed.  KAISHA WACHOB was informed that the remainder of the evaluation will be completed by another provider, this initial triage assessment does not replace that evaluation, and the importance of remaining in the ED until their evaluation is complete.     Jackelyn Hoehn, PA-C 03/11/22 1629

## 2022-04-14 ENCOUNTER — Ambulatory Visit (LOCAL_COMMUNITY_HEALTH_CENTER): Payer: Medicaid Other | Admitting: Nurse Practitioner

## 2022-04-14 ENCOUNTER — Encounter: Payer: Self-pay | Admitting: Nurse Practitioner

## 2022-04-14 VITALS — BP 140/100 | Ht 60.0 in | Wt 116.6 lb

## 2022-04-14 DIAGNOSIS — Z01419 Encounter for gynecological examination (general) (routine) without abnormal findings: Secondary | ICD-10-CM | POA: Diagnosis not present

## 2022-04-14 DIAGNOSIS — Z309 Encounter for contraceptive management, unspecified: Secondary | ICD-10-CM

## 2022-04-14 DIAGNOSIS — Z3042 Encounter for surveillance of injectable contraceptive: Secondary | ICD-10-CM

## 2022-04-14 DIAGNOSIS — F32A Depression, unspecified: Secondary | ICD-10-CM

## 2022-04-14 DIAGNOSIS — Z3009 Encounter for other general counseling and advice on contraception: Secondary | ICD-10-CM

## 2022-04-14 DIAGNOSIS — Z113 Encounter for screening for infections with a predominantly sexual mode of transmission: Secondary | ICD-10-CM

## 2022-04-14 DIAGNOSIS — A599 Trichomoniasis, unspecified: Secondary | ICD-10-CM

## 2022-04-14 DIAGNOSIS — Z3202 Encounter for pregnancy test, result negative: Secondary | ICD-10-CM

## 2022-04-14 LAB — WET PREP FOR TRICH, YEAST, CLUE
Trichomonas Exam: POSITIVE — AB
Yeast Exam: NEGATIVE

## 2022-04-14 LAB — PREGNANCY, URINE: Preg Test, Ur: NEGATIVE

## 2022-04-14 MED ORDER — METRONIDAZOLE 500 MG PO TABS
500.0000 mg | ORAL_TABLET | Freq: Two times a day (BID) | ORAL | 0 refills | Status: DC
Start: 2022-04-14 — End: 2023-03-15

## 2022-04-14 MED ORDER — MEDROXYPROGESTERONE ACETATE 150 MG/ML IM SUSP: 150.0000 mg | INTRAMUSCULAR | Status: AC

## 2022-04-14 NOTE — Progress Notes (Signed)
[facility] DEPARTMENT [facility] [address] Main Number: [phone]    Family Planning Visit- Initial Visit  Subjective:  Andrea Huang is a 36 y.o.  [id number]   being seen today for an initial annual visit and to discuss reproductive life planning.  The patient is currently using No Method - Other Reason for pregnancy prevention. Patient reports   does want a pregnancy in the next year.     report they are looking for a method that provides Other patient is thinking about conceiving   Patient has the following medical conditions has External hemorrhoid; Elevated blood pressure reading without diagnosis of hypertension; Heart murmur; History of PID; Smoker; and Vapes nicotine containing substance on their problem list.  Chief Complaint  Patient presents with   Contraception    Physical , STI Screening and depo shot     Patient reports to clinic today for a physical and potentially discuss birth control.     Body mass index is 22.77 kg/m. - Patient is eligible for diabetes screening based on BMI and age >41?  not applicable HA1C ordered? not applicable  Patient reports 1  partner/s in last year. Desires STI screening?  Yes  Has patient been screened once for HCV in the past?  No  No results found for: "HCVAB"  Does the patient have current drug use (including MJ), have a partner with drug use, and/or has been incarcerated since last result? No  If yes-- Screen for HCV through [facility]   Does the patient meet criteria for HBV testing? Yes  Criteria:  -Household, sexual or needle sharing contact with HBV -History of drug use -HIV positive -Those with known Hep C   Health Maintenance Due  Topic Date Due   COVID-19 Vaccine (1) Never done   Hepatitis C Screening  Never done   TETANUS/TDAP  11/07/2018   INFLUENZA VACCINE  03/31/2022    Review of Systems  Constitutional:  Negative for chills, fever, malaise/fatigue and  weight loss.  HENT:  Negative for congestion, hearing loss and sore throat.   Eyes:  Negative for blurred vision, double vision and photophobia.  Respiratory:  Negative for shortness of breath.   Cardiovascular:  Negative for chest pain.  Gastrointestinal:  Negative for abdominal pain, blood in stool, constipation, diarrhea, heartburn, nausea and vomiting.  Genitourinary:  Negative for dysuria and frequency.       Cramping Breast tenderness   Musculoskeletal:  Negative for back pain, joint pain and neck pain.  Skin:  Negative for itching and rash.  Neurological:  Negative for dizziness, weakness and headaches.  Endo/Heme/Allergies:  Does not bruise/bleed easily.  Psychiatric/Behavioral:  Negative for depression, substance abuse and suicidal ideas.     The following portions of the patient's history were reviewed and updated as appropriate: allergies, current medications, past family history, past medical history, past social history, past surgical history and problem list. Problem list updated.   See flowsheet for other program required questions.  Objective:   Vitals:   04/14/22 0959  BP: (!) 140/100  Weight: 116 lb 9.6 oz (52.9 kg)  Height: 5' (1.524 m)    Physical Exam Constitutional:      Appearance: Normal appearance.  HENT:     Head: Normocephalic. No abrasion, masses or laceration. Hair is normal.     Jaw: No tenderness or swelling.     Right Ear: External ear normal.     Left Ear: External ear normal.  Nose: Nose normal.     Mouth/Throat:     Lips: Pink. No lesions.     Mouth: Mucous membranes are moist. No lacerations or oral lesions.     Dentition: No dental caries.     Tongue: No lesions.     Palate: No mass and lesions.     Pharynx: No pharyngeal swelling, oropharyngeal exudate, posterior oropharyngeal erythema or uvula swelling.     Tonsils: No tonsillar exudate or tonsillar abscesses.  Eyes:     Pupils: Pupils are equal, round, and reactive to light.   Neck:     Thyroid: No thyroid mass, thyromegaly or thyroid tenderness.  Cardiovascular:     Rate and Rhythm: Normal rate and regular rhythm.  Pulmonary:     Effort: Pulmonary effort is normal.     Breath sounds: Normal breath sounds.  Chest:  Breasts:    Right: Normal. No swelling, mass, nipple discharge, skin change or tenderness.     Left: Normal. No swelling, mass, nipple discharge, skin change or tenderness.  Abdominal:     General: Abdomen is flat. Bowel sounds are normal.     Palpations: Abdomen is soft.     Tenderness: There is no abdominal tenderness. There is no rebound.  Genitourinary:    Pubic Area: No rash or pubic lice.      Labia:        Right: No rash, tenderness or lesion.        Left: No rash, tenderness or lesion.      Vagina: Normal. No vaginal discharge, erythema, tenderness or lesions.     Cervix: No cervical motion tenderness, discharge, lesion or erythema.     Uterus: Normal.      Adnexa:        Right: No tenderness.         Left: No tenderness.       Rectum: External hemorrhoid present.     Comments: Amount Discharge: small  Odor: Yes pH: greater than 4.5 Adheres to vaginal wall: No Color: color of discharge matches the Fawn Desrocher swab Musculoskeletal:     Cervical back: Full passive range of motion without pain and normal range of motion.  Lymphadenopathy:     Cervical: No cervical adenopathy.     Right cervical: No superficial, deep or posterior cervical adenopathy.    Left cervical: No superficial, deep or posterior cervical adenopathy.     Upper Body:     Right upper body: No supraclavicular, axillary or epitrochlear adenopathy.     Left upper body: No supraclavicular, axillary or epitrochlear adenopathy.     Lower Body: No right inguinal adenopathy. No left inguinal adenopathy.  Skin:    General: Skin is warm and dry.     Findings: No erythema, laceration, lesion or rash.  Neurological:     Mental Status: She is alert and oriented to person,  place, and time.  Psychiatric:        Attention and Perception: Attention normal.        Mood and Affect: Mood normal.        Speech: Speech normal.        Behavior: Behavior normal. Behavior is cooperative.       Assessment and Plan:  Andrea Huang is a 36 y.o. female presenting to the [facility] Department for an initial annual wellness/contraceptive visit  Contraception counseling: Reviewed options based on patient desire and reproductive life plan. Patient is interested in Hormonal Injection. This was provided to  the patient today.   Risks, benefits, and typical effectiveness rates were reviewed.  Questions were answered.  Written information was also given to the patient to review.    The patient will follow up in  11 weeks for surveillance and Depo.  The patient was told to call with any further questions, or with any concerns about this method of contraception.  Emphasized use of condoms 100% of the time for STI prevention.  Need for ECP was assessed.  ECP not offered due to patient having a desire to become pregnant.    1. Family planning counseling -36 year old female in clinic today for a physical, STD screening, and to discuss birth control.  Patient desires to become pregnant but is unsure if getting on birth control will help her conceive.  Patient received a Depo in 08/2021.  Informed patient that at times it takes a while for a birth control method to fully be eliminated out of your system.  During this time periods may also be irregular.  Patient has some concerns about fibroids and cyst, non palpated today but patient did have some lower abdominal discomfort upon palpation.  For additional follow up recommended patient obtain a OBGYN.   -Urine PT today, PT negative  -BP elevated at visit and patient reports history of BP problems.  Recommended that patient to obtain a PCP.  PCP list provided to patient. -Patient with complaints of breast tenderness, no  nodules or masses noted.   Recommended that patient limit caffeine, stop smoking, and may also take Vitamin E daily.    - Pregnancy, urine  2. Well woman exam with routine gynecological exam -Normal well woman exam. -CBE today due to breast tenderness, next due 03/2025 -PAP due 08/2025  3. Surveillance for Depo-Provera contraception -May have Depo 150 MG IM q 11-13 weeks x 1 year.  - medroxyPROGESTERone (DEPO-PROVERA) injection 150 mg  4. Screening examination for venereal disease -STD screening today. -Patient accepted all screenings including oral, vaginal CT/GC, wet prep and bloodwork for HIV/RPR.  Patient meets criteria for HepB screening? Yes. Ordered? No - refused Patient meets criteria for HepC screening? Yes. Ordered? No - refused  Treat wet prep per standing order Discussed time line for State Lab results and that patient will be called with positive results and encouraged patient to call if she had not heard in 2 weeks.  Counseled to return or seek care for continued or worsening symptoms Recommended condom use with all sex  Patient is currently not using  contraception  to prevent pregnancy.    - Chlamydia/Gonorrhea [facility] - Chlamydia/Gonorrhea [facility] - WET PREP FOR TRICH, YEAST, CLUE  5. Depression, unspecified depression type -PHQ-9=5, denies thoughts of self harm.  Patient has a history of depression.  Agrees to counseling services today.  - Ambulatory referral to Behavioral Health   6. Trichimoniasis Wet mount reviewed, please treat for Trich.  - metroNIDAZOLE (FLAGYL) 500 MG tablet; Take 1 tablet (500 mg total) by mouth 2 (two) times daily.  Dispense: 14 tablet; Refill: 0   Return in about 11 weeks (around 06/30/2022) for Routine DMPA injection.  Total time spent: 30 minutes.   Glenna Fellows, FNP

## 2022-04-14 NOTE — Progress Notes (Signed)
Pt here for physical, STI screening and birth control.  Wet mount reviewed.  Pt positive for Trich.  Metronidazole 500mg  #14 dispensed.  Pt counseled on medication, side effects, ETOH use, and when to contact clinic for issues or concerns.  Pt verbalizes understanding.  Card for partner x 1.  Pt declines Depo Provera injection at this time.  States she will return at a later date to get. Condoms declined.- , RN

## 2022-05-06 ENCOUNTER — Ambulatory Visit: Payer: Medicaid Other | Admitting: Licensed Clinical Social Worker

## 2022-05-19 ENCOUNTER — Encounter: Payer: Self-pay | Admitting: Emergency Medicine

## 2022-05-19 ENCOUNTER — Other Ambulatory Visit: Payer: Self-pay

## 2022-05-19 ENCOUNTER — Emergency Department: Payer: Medicaid Other

## 2022-05-19 ENCOUNTER — Emergency Department
Admission: EM | Admit: 2022-05-19 | Discharge: 2022-05-19 | Disposition: A | Discharge status: Home / Self Care | Payer: Medicaid Other | Attending: Emergency Medicine | Admitting: Emergency Medicine

## 2022-05-19 DIAGNOSIS — W109XXA Fall (on) (from) unspecified stairs and steps, initial encounter: Secondary | ICD-10-CM | POA: Diagnosis not present

## 2022-05-19 DIAGNOSIS — S43402A Unspecified sprain of left shoulder joint, initial encounter: Secondary | ICD-10-CM | POA: Diagnosis not present

## 2022-05-19 DIAGNOSIS — S4992XA Unspecified injury of left shoulder and upper arm, initial encounter: Secondary | ICD-10-CM | POA: Diagnosis present

## 2022-05-19 MED ORDER — NAPROXEN 500 MG PO TABS: 500.0000 mg | ORAL_TABLET | Freq: Two times a day (BID) | ORAL | 0 refills | Status: AC

## 2022-05-19 MED ORDER — CYCLOBENZAPRINE HCL 10 MG PO TABS
10.0000 mg | ORAL_TABLET | Freq: Three times a day (TID) | ORAL | 0 refills | Status: AC | PRN
Start: 2022-05-19 — End: 2022-05-24

## 2022-05-19 MED ORDER — NAPROXEN 500 MG PO TABS
500.0000 mg | ORAL_TABLET | Freq: Once | ORAL | Status: AC
  Administered 2022-05-19: 500 mg via ORAL
  Filled 2022-05-19: qty 1

## 2022-05-19 NOTE — ED Notes (Signed)
See triage note  Presents s/p fall   States she fell down some steps yesterday  Landed on left shoulder   Tenderness noted on palpation  Good pulses

## 2022-05-19 NOTE — ED Triage Notes (Signed)
Pt via POV from home. Pt c/o L shoulder pain after fall down some steps. Denies head injury. Denies LOC. Pt is A&Ox4 and NAD

## 2022-05-19 NOTE — ED Notes (Signed)
Sling placed on L arm. CMS intact distally, pt reporting increased comfort post sling placement.

## 2022-05-19 NOTE — Discharge Instructions (Addendum)
-  You may take naproxen and Tylenol as needed for pain.  You may additionally take cyclobenzaprine for muscle relaxation, though use caution as it may make you dizzy/drowsy.  -You may use the sling as needed for comfort.  -Please follow-up with the orthopedist listed in these instructions if your symptoms fail to improve after 1 week.  You can call the number listed to schedule an appointment.  -Return to the emergency department anytime if you begin to experience any new or worsening symptoms.

## 2022-05-19 NOTE — ED Provider Notes (Signed)
[facility] Provider Note    Event Date/Time   First MD Initiated Contact with Patient 05/19/22 [phone]     (approximate)   History   Chief Complaint Shoulder Injury   HPI Andrea Huang is a 36 y.o. female, history of anemia, hemorrhoids, presents to the emergency department for evaluation of left shoulder pain.  She states that she was going upstairs, however missed her step because there are no lights along the stairwell.  This caused her to fall down approximately 12 steps.  She states that she feels fine everywhere, except for her left shoulder.  She states it feels stiff and feels like it "needs to pop".  Denies any head injury.  Denies blood thinner use.  Denies elbow pain, forearm pain, wrist pain, or hand pain.  Denies numbness/tingling in the affected extremity.  Denies headache, neck pain, chest pain, shortness of breath, or abdominal pain.  History Limitations: No limitations.        Physical Exam  Triage Vital Signs: ED Triage Vitals  Enc Vitals Group     BP 05/19/22 0914 (!) 133/100     Pulse Rate 05/19/22 0914 95     Resp 05/19/22 0914 20     Temp 05/19/22 0914 98.4 F (36.9 C)     Temp Source 05/19/22 0914 Oral     SpO2 05/19/22 0914 98 %     Weight 05/19/22 0917 120 lb (54.4 kg)     Height 05/19/22 0917 5\' 3"  (1.6 m)     Head Circumference --      Peak Flow --      Pain Score 05/19/22 0915 9     Pain Loc --      Pain Edu? --      Excl. in [place]? --     Most recent vital signs: Vitals:   05/19/22 0914  BP: (!) 133/100  Pulse: 95  Resp: 20  Temp: 98.4 F (36.9 C)  SpO2: 98%    General: Awake, NAD.  Skin: Warm, dry. No rashes or lesions.  Eyes: PERRL. Conjunctivae normal.  CV: Good peripheral perfusion.  Resp: Normal effort.  Abd: Soft, non-tender. No distention.  Neuro: At baseline. No gross neurological deficits.  Musculoskeletal: Normal ROM of all extremities.   Focused Exam: No gross deformities to the left  upper extremity.  No significant bony tenderness along the clavicle, proximal humerus, or scapula.  Endorses some tenderness with abduction, however she has full range of motion of the shoulder joint.  PMS intact distally.  Physical Exam    ED Results / Procedures / Treatments  Labs (all labs ordered are listed, but only abnormal results are displayed) Labs Reviewed - No data to display   EKG N/A.   RADIOLOGY  ED Provider Interpretation: I personally viewed and interpreted this x-ray.  No evidence of acute fractures or dislocations.  DG Shoulder Left  Result Date: 05/19/2022 CLINICAL DATA:  Shoulder injury, fall EXAM: LEFT SHOULDER - 2+ VIEW COMPARISON:  None Available. FINDINGS: There is no evidence of fracture or dislocation. There is no evidence of arthropathy or other focal bone abnormality. Soft tissues are unremarkable. IMPRESSION: Negative. Electronically Signed   By: Rolm Baptise M.D.   On: 05/19/2022 10:00    PROCEDURES:  Critical Care performed: N/A.  Procedures    MEDICATIONS ORDERED IN ED: Medications - No data to display   IMPRESSION / MDM / [place] / ED COURSE  I reviewed the triage  vital signs and the nursing notes.                              Differential diagnosis includes, but is not limited to, shoulder sprain, proximal humerus fracture, clavicle injury, rotator cuff injury, torn labrum, shoulder dislocation/subluxation.  Assessment/Plan Patient presents with left shoulder pain following fall down stairs steps earlier today.  Physical exam is unimpressive.  She maintains full range of motion of the affected joint.  She is neurovascularly intact.  Left shoulder x-ray does not show any signs of fractures or dislocations.  I suspect likely shoulder sprain/contusion.  Very low suspicion for occult injury warranting advanced imaging at this time.  We will provide her with prescription for naproxen and cyclobenzaprine.  Provided her with a sling  for comfort.  Encouraged her to follow-up with her primary care provider or orthopedist as needed if her symptoms persist after a few days.  Will discharge.  Provided the patient with anticipatory guidance, return precautions, and educational material. Encouraged the patient to return to the emergency department at any time if they begin to experience any new or worsening symptoms. Patient expressed understanding and agreed with the plan.   Patient's presentation is most consistent with acute complicated illness / injury requiring diagnostic workup.       FINAL CLINICAL IMPRESSION(S) / ED DIAGNOSES   Final diagnoses:  Sprain of left shoulder, unspecified shoulder sprain type, initial encounter     Rx / DC Orders   ED Discharge Orders          Ordered    naproxen (NAPROSYN) 500 MG tablet  2 times daily with meals        05/19/22 1008    cyclobenzaprine (FLEXERIL) 10 MG tablet  3 times daily PRN        05/19/22 1008             Note:  This document was prepared using Dragon voice recognition software and may include unintentional dictation errors.   Varney Daily, PA 05/19/22 1011    Shaune Pollack, MD 05/20/22 [phone]

## 2022-08-18 ENCOUNTER — Ambulatory Visit: Payer: Medicaid Other

## 2022-12-27 ENCOUNTER — Emergency Department
Admission: EM | Admit: 2022-12-27 | Discharge: 2022-12-27 | Disposition: A | Discharge status: Home / Self Care | Payer: Medicaid Other | Attending: Emergency Medicine | Admitting: Emergency Medicine

## 2022-12-27 ENCOUNTER — Emergency Department: Payer: Medicaid Other

## 2022-12-27 ENCOUNTER — Encounter: Payer: Self-pay | Admitting: Emergency Medicine

## 2022-12-27 ENCOUNTER — Other Ambulatory Visit: Payer: Self-pay

## 2022-12-27 DIAGNOSIS — I10 Essential (primary) hypertension: Secondary | ICD-10-CM | POA: Diagnosis not present

## 2022-12-27 DIAGNOSIS — J181 Lobar pneumonia, unspecified organism: Secondary | ICD-10-CM | POA: Insufficient documentation

## 2022-12-27 DIAGNOSIS — R030 Elevated blood-pressure reading, without diagnosis of hypertension: Secondary | ICD-10-CM | POA: Diagnosis present

## 2022-12-27 DIAGNOSIS — T148XXA Other injury of unspecified body region, initial encounter: Secondary | ICD-10-CM

## 2022-12-27 DIAGNOSIS — J189 Pneumonia, unspecified organism: Secondary | ICD-10-CM

## 2022-12-27 LAB — COMPREHENSIVE METABOLIC PANEL
ALT: 12 U/L (ref 0–44)
AST: 22 U/L (ref 15–41)
Albumin: 3.3 g/dL — ABNORMAL LOW (ref 3.5–5.0)
Alkaline Phosphatase: 74 U/L (ref 38–126)
Anion gap: 6 (ref 5–15)
BUN: 10 mg/dL (ref 6–20)
CO2: 25 mmol/L (ref 22–32)
Calcium: 8.8 mg/dL — ABNORMAL LOW (ref 8.9–10.3)
Chloride: 105 mmol/L (ref 98–111)
Creatinine, Ser: 0.56 mg/dL (ref 0.44–1.00)
GFR, Estimated: >60 mL/min (ref >60)
Glucose, Bld: 91 mg/dL (ref 70–99)
Potassium: 3.7 mmol/L (ref 3.5–5.1)
Sodium: 136 mmol/L (ref 135–145)
Total Bilirubin: 0.2 mg/dL — ABNORMAL LOW (ref 0.3–1.2)
Total Protein: 8.2 g/dL — ABNORMAL HIGH (ref 6.5–8.1)

## 2022-12-27 LAB — CBC
HCT: 34.5 % — ABNORMAL LOW (ref 36.0–46.0)
Hemoglobin: 11.1 g/dL — ABNORMAL LOW (ref 12.0–15.0)
MCH: 26.5 pg (ref 26.0–34.0)
MCHC: 32.2 g/dL (ref 30.0–36.0)
MCV: 82.3 fL (ref 80.0–100.0)
Platelets: 345 10*3/uL (ref 150–400)
RBC: 4.19 MIL/uL (ref 3.87–5.11)
RDW: 13.7 % (ref 11.5–15.5)
WBC: 4.2 10*3/uL (ref 4.0–10.5)
nRBC: 0 % (ref 0.0–0.2)

## 2022-12-27 MED ORDER — NAPROXEN 500 MG PO TABS: 500.0000 mg | ORAL_TABLET | Freq: Two times a day (BID) | ORAL | 2 refills | Status: DC

## 2022-12-27 MED ORDER — CEFDINIR 300 MG PO CAPS: 300.0000 mg | ORAL_CAPSULE | Freq: Two times a day (BID) | ORAL | 0 refills | Status: AC

## 2022-12-27 MED ORDER — IOHEXOL 300 MG/ML  SOLN
75.0000 mL | Freq: Once | INTRAMUSCULAR | Status: AC | PRN
  Administered 2022-12-27: 75 mL via INTRAVENOUS

## 2022-12-27 MED ORDER — METHOCARBAMOL 500 MG PO TABS: 500.0000 mg | ORAL_TABLET | Freq: Three times a day (TID) | ORAL | 1 refills | Status: DC | PRN

## 2022-12-27 MED ORDER — NAPROXEN 500 MG PO TABS
500.0000 mg | ORAL_TABLET | Freq: Once | ORAL | Status: AC
  Administered 2022-12-27: 500 mg via ORAL
  Filled 2022-12-27: qty 1

## 2022-12-27 NOTE — ED Triage Notes (Addendum)
Pt via POV from home. Pt c/o L side pain states the pain is in her L side of her neck, arm, and torso. Denies injury. Denies any NVD. States that she is unable to lay on that side due to the pain. Pain has been constant for a month. Pt is hypertensive with no hx. Pt does endorse a headache. Pt is A&Ox4 and NAD

## 2022-12-27 NOTE — ED Notes (Signed)
Urine preg  negative result

## 2022-12-27 NOTE — ED Provider Notes (Signed)
[facility] Provider Note    Event Date/Time   First MD Initiated Contact with Patient 12/27/22 0957     (approximate)   History   Hypertension and Left Side Pain   HPI  Andrea Huang is a 37 y.o. female who presents with complaints of left back pain for several weeks.  She reports it is uncomfortable to lay on that side.  Denies significant fevers, no cough, no night sweats.  No injury to the area.     Physical Exam   Triage Vital Signs: ED Triage Vitals  Enc Vitals Group     BP 12/27/22 0953 (!) 186/103     Pulse Rate 12/27/22 0953 99     Resp 12/27/22 0953 18     Temp 12/27/22 0953 97.8 F (36.6 C)     Temp Source 12/27/22 0953 Oral     SpO2 12/27/22 0953 99 %     Weight 12/27/22 0951 54.4 kg (120 lb)     Height 12/27/22 0951 1.524 m (5')     Head Circumference --      Peak Flow --      Pain Score 12/27/22 0951 10     Pain Loc --      Pain Edu? --      Excl. in [place]? --     Most recent vital signs: Vitals:   12/27/22 1006 12/27/22 1322  BP:  (!) 150/84  Pulse: 87 80  Resp:  18  Temp:    SpO2: 100% 99%     General: Awake, no distress.  CV:  Good peripheral perfusion.  Resp:  Normal effort.  Clear to auscultation bilaterally Abd:  No distention.  Other:  No rash, tenderness to palpation just inferior to the scapula which replicates her pain suspect muscle spasm   ED Results / Procedures / Treatments   Labs (all labs ordered are listed, but only abnormal results are displayed) Labs Reviewed  CBC - Abnormal; Notable for the following components:      Result Value   Hemoglobin 11.1 (*)    HCT 34.5 (*)    All other components within normal limits  COMPREHENSIVE METABOLIC PANEL - Abnormal; Notable for the following components:   Calcium 8.8 (*)    Total Protein 8.2 (*)    Albumin 3.3 (*)    Total Bilirubin 0.2 (*)    All other components within normal limits     EKG     RADIOLOGY Chest x-ray viewed  interpreted by me, left-sided pleural effusion noted    PROCEDURES:  Critical Care performed:   Procedures   MEDICATIONS ORDERED IN ED: Medications  iohexol (OMNIPAQUE) 300 MG/ML solution 75 mL (75 mLs Intravenous Contrast Given 12/27/22 1224)  naproxen (NAPROSYN) tablet 500 mg (500 mg Oral Given 12/27/22 1321)     IMPRESSION / MDM / ASSESSMENT AND PLAN / ED COURSE  I reviewed the triage vital signs and the nursing notes. Patient's presentation is most consistent with acute illness / injury with system symptoms.   Patient presents with left-sided back pain as detailed above, suspect musculoskeletal injury but given discomfort and feeling of breathlessness occasionally when laying on that side, will obtain x-ray  X-ray demonstrates pleural effusion, possible atelectasis versus pneumonia, will obtain labs, CT with contrast to characterize more fully.  CT scan suspicious for pneumonia with effusion, overall the patient remains well-appearing, oxygen saturations heart rate normal.  Respiratory rate normal.  Will start on cefdinir x 14  days, discussed with patient the need for follow-up chest x-ray with PCP to ensure resolution, return precautions discussed.     FINAL CLINICAL IMPRESSION(S) / ED DIAGNOSES   Final diagnoses:  Uncontrolled hypertension  Muscle strain  Community acquired pneumonia of left lower lobe of lung     Rx / DC Orders   ED Discharge Orders          Ordered    methocarbamol (ROBAXIN) 500 MG tablet  Every 8 hours PRN        12/27/22 1030    naproxen (NAPROSYN) 500 MG tablet  2 times daily with meals        12/27/22 1030    cefdinir (OMNICEF) 300 MG capsule  2 times daily        12/27/22 1306             Note:  This document was prepared using Dragon voice recognition software and may include unintentional dictation errors.   Jene Every, MD 12/27/22 1329

## 2022-12-27 NOTE — ED Notes (Signed)
Pt ambulatory to room in no acute distress. Respirations unlabored.

## 2022-12-27 NOTE — ED Notes (Signed)
Pt has tenderness to L arm, shoulder. Examined by EDP Kinner. Pt is hypertensive, no hx. Denies HA, vision changes.

## 2023-02-03 ENCOUNTER — Other Ambulatory Visit: Payer: Self-pay

## 2023-02-03 ENCOUNTER — Encounter: Payer: Self-pay | Admitting: Emergency Medicine

## 2023-02-03 ENCOUNTER — Emergency Department
Admission: EM | Admit: 2023-02-03 | Discharge: 2023-02-03 | Disposition: A | Discharge status: Home / Self Care | Payer: Medicaid Other | Attending: Emergency Medicine | Admitting: Emergency Medicine

## 2023-02-03 DIAGNOSIS — B349 Viral infection, unspecified: Secondary | ICD-10-CM | POA: Diagnosis not present

## 2023-02-03 DIAGNOSIS — J029 Acute pharyngitis, unspecified: Secondary | ICD-10-CM | POA: Diagnosis present

## 2023-02-03 DIAGNOSIS — I10 Essential (primary) hypertension: Secondary | ICD-10-CM | POA: Insufficient documentation

## 2023-02-03 DIAGNOSIS — Z20822 Contact with and (suspected) exposure to covid-19: Secondary | ICD-10-CM | POA: Diagnosis not present

## 2023-02-03 LAB — GROUP A STREP BY PCR: Group A Strep by PCR: NOT DETECTED

## 2023-02-03 LAB — SARS CORONAVIRUS 2 BY RT PCR: SARS Coronavirus 2 by RT PCR: NEGATIVE

## 2023-02-03 MED ORDER — SODIUM CHLORIDE 0.9 % IV BOLUS
1000.0000 mL | Freq: Once | INTRAVENOUS | Status: AC
  Administered 2023-02-03: 1000 mL via INTRAVENOUS

## 2023-02-03 MED ORDER — KETOROLAC TROMETHAMINE 30 MG/ML IJ SOLN
15.0000 mg | Freq: Once | INTRAMUSCULAR | Status: AC
  Administered 2023-02-03: 15 mg via INTRAVENOUS
  Filled 2023-02-03: qty 1

## 2023-02-03 MED ORDER — NAPROXEN 500 MG PO TABS: 500.0000 mg | ORAL_TABLET | Freq: Two times a day (BID) | ORAL | 0 refills | Status: DC

## 2023-02-03 MED ORDER — HYDROCHLOROTHIAZIDE 25 MG PO TABS: 25.0000 mg | ORAL_TABLET | Freq: Every day | ORAL | 0 refills | Status: DC

## 2023-02-03 MED ORDER — MAGIC MOUTHWASH W/LIDOCAINE: 10.0000 mL | Freq: Four times a day (QID) | ORAL | 0 refills | Status: DC | PRN

## 2023-02-03 NOTE — ED Triage Notes (Signed)
Pt complains of sore throat and bodyaches x 2 days. Chills and body sweats but no fever that she is aware of. No meds PTA

## 2023-02-03 NOTE — Discharge Instructions (Addendum)
Take the Naprosyn 2 times per day. Use the Magic Mouthwash for you sore throat. Follow up with primary care if not improving over the next few days. Return to the ER for symptoms that change or worsen if unable to schedule an appointment.

## 2023-02-03 NOTE — ED Provider Notes (Signed)
   [facility] Provider Note    Event Date/Time   First MD Initiated Contact with Patient 02/03/23 1320     (approximate)   History   Sore Throat and Generalized Body Aches   HPI  Andrea Huang is a 37 y.o. female with no significant past medical history and as listed in EMR presents to the emergency department for treatment and evaluation of sore throat, body aches, chills, and sweating x 2 days. No known fever or exposure to illness. She has not taken any OTC medications.    Physical Exam   Triage Vital Signs: ED Triage Vitals  Enc Vitals Group     BP 02/03/23 1129 (!) 165/116     Pulse Rate 02/03/23 1129 (!) 106     Resp 02/03/23 1129 19     Temp 02/03/23 1129 98.8 F (37.1 C)     Temp Source 02/03/23 1129 Oral     SpO2 02/03/23 1129 98 %     Weight 02/03/23 1133 119 lb 0.8 oz (54 kg)     Height --      Head Circumference --      Peak Flow --      Pain Score 02/03/23 1132 8     Pain Loc --      Pain Edu? --      Excl. in [place]? --     Most recent vital signs: Vitals:   02/03/23 1129  BP: (!) 165/116  Pulse: (!) 106  Resp: 19  Temp: 98.8 F (37.1 C)  SpO2: 98%    General: Awake, no distress.  CV:  Good peripheral perfusion.  Resp:  Normal effort. Breath sounds clear. Abd:  No distention.  Other:  Erythema of posterior oropharynx. No tonsillar exudate.   ED Results / Procedures / Treatments   Labs (all labs ordered are listed, but only abnormal results are displayed) Labs Reviewed  SARS CORONAVIRUS 2 BY RT PCR  GROUP A STREP BY PCR     EKG  Not indicated.   RADIOLOGY  Image and radiology report reviewed and interpreted by me. Radiology report consistent with the same.  Not indicated.  PROCEDURES:  Critical Care performed: No  Procedures   MEDICATIONS ORDERED IN ED:  Medications - No data to display   IMPRESSION / MDM / ASSESSMENT AND PLAN / ED COURSE   I have reviewed the triage  note.  Differential diagnosis includes, but is not limited to, influenza, strep throat, Covid, viral URI,  Patient's presentation is most consistent with {EM COPA:27473}  {**The patient is on the cardiac monitor to evaluate for evidence of arrhythmia and/or significant heart rate changes.**}      FINAL CLINICAL IMPRESSION(S) / ED DIAGNOSES   Final diagnoses:  None     Rx / DC Orders   ED Discharge Orders     None        Note:  This document was prepared using Dragon voice recognition software and may include unintentional dictation errors.

## 2023-02-25 ENCOUNTER — Ambulatory Visit: Payer: Medicaid Other

## 2023-03-09 ENCOUNTER — Emergency Department: Payer: Medicaid Other

## 2023-03-09 ENCOUNTER — Other Ambulatory Visit: Payer: Self-pay

## 2023-03-09 ENCOUNTER — Encounter: Payer: Self-pay | Admitting: Emergency Medicine

## 2023-03-09 ENCOUNTER — Emergency Department
Admission: EM | Admit: 2023-03-09 | Discharge: 2023-03-09 | Disposition: A | Discharge status: Home / Self Care | Payer: Medicaid Other | Attending: Emergency Medicine | Admitting: Emergency Medicine

## 2023-03-09 DIAGNOSIS — R0781 Pleurodynia: Secondary | ICD-10-CM | POA: Insufficient documentation

## 2023-03-09 DIAGNOSIS — R519 Headache, unspecified: Secondary | ICD-10-CM | POA: Insufficient documentation

## 2023-03-09 DIAGNOSIS — R0789 Other chest pain: Secondary | ICD-10-CM | POA: Diagnosis present

## 2023-03-09 LAB — URINALYSIS, W/ REFLEX TO CULTURE (INFECTION SUSPECTED)
Bilirubin Urine: NEGATIVE
Glucose, UA: NEGATIVE mg/dL
Hgb urine dipstick: NEGATIVE
Ketones, ur: NEGATIVE mg/dL
Leukocytes,Ua: NEGATIVE
Nitrite: NEGATIVE
Protein, ur: NEGATIVE mg/dL
Specific Gravity, Urine: 1.011 (ref 1.005–1.030)
pH: 6 (ref 5.0–8.0)

## 2023-03-09 LAB — BASIC METABOLIC PANEL
Anion gap: 8 (ref 5–15)
BUN: 10 mg/dL (ref 6–20)
CO2: 26 mmol/L (ref 22–32)
Calcium: 8.7 mg/dL — ABNORMAL LOW (ref 8.9–10.3)
Chloride: 104 mmol/L (ref 98–111)
Creatinine, Ser: 0.75 mg/dL (ref 0.44–1.00)
GFR, Estimated: >60 mL/min (ref >60)
Glucose, Bld: 99 mg/dL (ref 70–99)
Potassium: 3.1 mmol/L — ABNORMAL LOW (ref 3.5–5.1)
Sodium: 138 mmol/L (ref 135–145)

## 2023-03-09 LAB — CBC
HCT: 30.3 % — ABNORMAL LOW (ref 36.0–46.0)
Hemoglobin: 9.6 g/dL — ABNORMAL LOW (ref 12.0–15.0)
MCH: 25.8 pg — ABNORMAL LOW (ref 26.0–34.0)
MCHC: 31.7 g/dL (ref 30.0–36.0)
MCV: 81.5 fL (ref 80.0–100.0)
Platelets: 494 10*3/uL — ABNORMAL HIGH (ref 150–400)
RBC: 3.72 MIL/uL — ABNORMAL LOW (ref 3.87–5.11)
RDW: 15.5 % (ref 11.5–15.5)
WBC: 5.7 10*3/uL (ref 4.0–10.5)
nRBC: 0 % (ref 0.0–0.2)

## 2023-03-09 LAB — TROPONIN I (HIGH SENSITIVITY)
Troponin I (High Sensitivity): 23 ng/L — ABNORMAL HIGH (ref <18)
Troponin I (High Sensitivity): 21 ng/L — ABNORMAL HIGH (ref <18)

## 2023-03-09 LAB — D-DIMER, QUANTITATIVE: D-Dimer, Quant: 6.54 ug/mL-FEU — ABNORMAL HIGH (ref 0.00–0.50)

## 2023-03-09 LAB — POC URINE PREG, ED: Preg Test, Ur: NEGATIVE

## 2023-03-09 MED ORDER — SODIUM CHLORIDE 0.9 % IV BOLUS
500.0000 mL | Freq: Once | INTRAVENOUS | Status: AC
  Administered 2023-03-09: 500 mL via INTRAVENOUS

## 2023-03-09 MED ORDER — OXYCODONE-ACETAMINOPHEN 5-325 MG PO TABS
1.0000 | ORAL_TABLET | ORAL | Status: DC | PRN
  Administered 2023-03-09: 1 via ORAL
  Filled 2023-03-09: qty 1

## 2023-03-09 MED ORDER — DROPERIDOL 2.5 MG/ML IJ SOLN
2.5000 mg | Freq: Once | INTRAMUSCULAR | Status: AC
  Administered 2023-03-09: 2.5 mg via INTRAVENOUS
  Filled 2023-03-09: qty 2

## 2023-03-09 MED ORDER — POTASSIUM CHLORIDE CRYS ER 20 MEQ PO TBCR
40.0000 meq | EXTENDED_RELEASE_TABLET | Freq: Once | ORAL | Status: AC
  Administered 2023-03-09: 40 meq via ORAL

## 2023-03-09 MED ORDER — KETOROLAC TROMETHAMINE 30 MG/ML IJ SOLN
15.0000 mg | Freq: Once | INTRAMUSCULAR | Status: AC
  Administered 2023-03-09: 15 mg via INTRAVENOUS
  Filled 2023-03-09: qty 1

## 2023-03-09 MED ORDER — IOHEXOL 350 MG/ML SOLN: 75.0000 mL | Freq: Once | INTRAVENOUS | Status: DC | PRN

## 2023-03-09 NOTE — ED Notes (Signed)
Discharge papers reviewed with the patient - Pt declined this RN obtaining VS.

## 2023-03-09 NOTE — Discharge Instructions (Signed)
As we discussed, you have an elevated blood test called a D-dimer, and we strongly encouraged you to get a test called a CTA chest to rule out a pulmonary embolism.  This condition, if left undiagnosed, can lead to permanent disability or death.  However you refused any additional testing at this time and state you just want to go home.  This is your decision, but please note that you should follow-up at the next available opportunity with your regular doctor or return at any time to the emergency department for additional evaluation.

## 2023-03-09 NOTE — ED Provider Notes (Signed)
[facility] Provider Note    Event Date/Time   First MD Initiated Contact with Patient 03/09/23 0440     (approximate)   History   Chest Pain and Headache   HPI Andrea Huang is a 37 y.o. female who presents for evaluation of about a week of headache and a week of right-sided chest pain.  She said that she was at a party and fell while running and she landed on her right side.  She has had the pain in her ribs since that time.  Hurts more when she takes deep breaths or when she moves in certain ways.  She said that her headache has been more or less going on since that time as well although she did not hit her head.  She denies using any alcohol or drugs on the night that she fell.  She said that the pain is in the back of her head and radiates throughout the rest of her head.  She is sensitive to light and has had some nausea but no vomiting.  She has otherwise felt well with no fevers or chills, other chest pain, difficulty breathing, worsening cough (she said that any amount of coughing or sneezing causes her ribs to hurt worse), abdominal pain, dysuria.  Nothing in particular is making her headache better and she has been taking Tylenol at home.  She has no history of migraines.  She denies having any weakness in her extremities, difficulty with word finding, or difficulty with balance.     Physical Exam   Triage Vital Signs: ED Triage Vitals  Enc Vitals Group     BP 03/09/23 0020 (!) 165/122     Pulse Rate 03/09/23 0020 99     Resp 03/09/23 0020 18     Temp 03/09/23 0020 98.5 F (36.9 C)     Temp Source 03/09/23 0020 Oral     SpO2 03/09/23 0020 98 %     Weight 03/09/23 0022 55.2 kg (121 lb 11.1 oz)     Height 03/09/23 0022 1.524 m (5')     Head Circumference --      Peak Flow --      Pain Score 03/09/23 0018 10     Pain Loc --      Pain Edu? --      Excl. in GC? --     Most recent vital signs: Vitals:   03/09/23 0252 03/09/23 0531  BP:  (!) 184/135 (!) 155/68  Pulse: 91 89  Resp: 20 20  Temp: (!) 96.9 F (36.1 C) 98 F (36.7 C)  SpO2: 98% 98%    General: Awake, generally well-appearing.  No obvious distress at this time. CV:  Good peripheral perfusion.  Normal heart sounds.  Regular rate and rhythm. Resp:  Normal effort. Speaking easily and comfortably, no accessory muscle usage nor intercostal retractions.  Lungs are clear to auscultation bilaterally. Abd:  No distention.  Other:  Patient has highly reproducible right sided anterior chest wall/rib pain.  However she is also reporting pain with deep inspiration.  Her pupils are relatively small bilaterally and minimally reactive to light but they are equal.  No appreciable focal neurological deficits.   ED Results / Procedures / Treatments   Labs (all labs ordered are listed, but only abnormal results are displayed) Labs Reviewed  BASIC METABOLIC PANEL - Abnormal; Notable for the following components:      Result Value   Potassium 3.1 (*)  Calcium 8.7 (*)    All other components within normal limits  CBC - Abnormal; Notable for the following components:   RBC 3.72 (*)    Hemoglobin 9.6 (*)    HCT 30.3 (*)    MCH 25.8 (*)    Platelets 494 (*)    All other components within normal limits  D-DIMER, QUANTITATIVE - Abnormal; Notable for the following components:   D-Dimer, Quant 6.54 (*)    All other components within normal limits  URINALYSIS, W/ REFLEX TO CULTURE (INFECTION SUSPECTED) - Abnormal; Notable for the following components:   Color, Urine YELLOW (*)    APPearance CLEAR (*)    Bacteria, UA RARE (*)    All other components within normal limits  TROPONIN I (HIGH SENSITIVITY) - Abnormal; Notable for the following components:   Troponin I (High Sensitivity) 23 (*)    All other components within normal limits  TROPONIN I (HIGH SENSITIVITY) - Abnormal; Notable for the following components:   Troponin I (High Sensitivity) 21 (*)    All other components  within normal limits  POC URINE PREG, ED - Normal     EKG  ED ECG REPORT I, Loleta Rose, the attending physician, personally viewed and interpreted this ECG.  Date: 03/09/2023 EKG Time: 00: 26 Rate: 100 Rhythm: Borderline tachycardia QRS Axis: normal Intervals: normal ST/T Wave abnormalities: Non-specific ST segment / T-wave changes, but no clear evidence of acute ischemia. Narrative Interpretation: no definitive evidence of acute ischemia; does not meet STEMI criteria.    RADIOLOGY I viewed and interpreted the patient's chest x-ray.  I interpreted the some bibasilar atelectasis, but the radiologist mentions some concern over the possibility of infiltrate, similar to what was seen several months ago on a previous x-ray.   PROCEDURES:  Critical Care performed: No  Procedures    IMPRESSION / MDM / ASSESSMENT AND PLAN / ED COURSE  I reviewed the triage vital signs and the nursing notes.                              Differential diagnosis includes, but is not limited to, migraine, other nonspecific headache, infectious process such as a viral illness, uncontrolled hypertension, demand ischemia, nonspecific pleuritic chest pain, rib contusion, rib fracture, pneumonia, pleural effusion.  Patient's presentation is most consistent with acute presentation with potential threat to life or bodily function.  Labs/studies ordered: EKG, two-view chest x-ray, CBC, D-dimer, high-sensitivity troponin x 2, basic metabolic panel  Interventions/Medications given:  Medications  oxyCODONE-acetaminophen (PERCOCET/ROXICET) 5-325 MG per tablet 1 tablet (1 tablet Oral Given 03/09/23 0251)  iohexol (OMNIPAQUE) 350 MG/ML injection 75 mL (has no administration in time range)  potassium chloride SA (KLOR-CON M) CR tablet 40 mEq (40 mEq Oral Given 03/09/23 0527)  sodium chloride 0.9 % bolus 500 mL (0 mLs Intravenous Stopped 03/09/23 0600)  droperidol (INAPSINE) 2.5 MG/ML injection 2.5 mg (2.5 mg  Intravenous Given 03/09/23 0526)  ketorolac (TORADOL) 30 MG/ML injection 15 mg (15 mg Intravenous Given 03/09/23 0527)    (Note:  hospital course my include additional interventions and/or labs/studies not listed above.)   Patient's blood pressure is very elevated but it is difficult to know if this is primary hypertension or if it is related to her discomfort.  She apparently has no primary care and does not take blood pressure medicine.  I reviewed a note from an ED visit about 3 months ago and at that time she  was diagnosed with pneumonia and a pleural effusion.  I asked her about this and she said that she felt better after taking the antibiotics and the symptoms in her chest started again just about a week ago.  She has no significant risk factors for PE but she also has no visible sign of trauma nor radiographic evidence of rib fracture on the chest x-ray.  Additionally, she has a slight troponin leak, and although it is not rising from the first to the second test, it is notable particularly in an otherwise healthy young woman.  Given no history or evidence of trauma to her head, as well as the duration of symptoms being about a week and no focal neurological deficits, I will hold off on a head CT at least for now.  I will treat empirically for migraine with medications as listed above.  I will attempt to rule out pulmonary embolism with a D-dimer but we will proceed with CTA chest if necessary which will also further evaluate the lung parenchyma even though she is having no infectious symptoms but has a questionable chest x-ray.  Patient agrees with the plan.  The patient is on the cardiac monitor to evaluate for evidence of arrhythmia and/or significant heart rate changes.   Clinical Course as of 03/09/23 0716  Tue Mar 09, 2023  0607 D-Dimer, Quant(!): 6.54 Substantially elevated D-dimer.  I will proceed with CTA chest to rule out pulmonary embolism.  We will either add on an hCG serum  pregnancy test or ask her to provide a urine specimen [CF]  0616 Preg Test, Ur: Negative [CF]  1610 The patient no longer has a headache.  However, she is refusing the CTA chest.  With Daneil Dan (CT technologist) present in the room, I explained to the best my ability to the patient that she has a potentially life-threatening condition that could result in permanent disability or death if undiagnosed, and the only way I can diagnose it is with a CT scan of the chest.  She absolutely refuses to get the CT and keep saying she just wants to go home and that her head feels better.  Her nurse, Leanord Hawking, was also present outside the room and heard the conversation.  The patient has the capacity to make her own decisions and I have no recourse other than to let her go, but I strongly encouraged her to come back anytime and to follow-up as an outpatient.  She understands and refuses additional evaluation. [CF]    Clinical Course User Index [CF] Loleta Rose, MD     FINAL CLINICAL IMPRESSION(S) / ED DIAGNOSES   Final diagnoses:  Pleuritic chest pain  Acute nonintractable headache, unspecified headache type     Rx / DC Orders   ED Discharge Orders     None        Note:  This document was prepared using Dragon voice recognition software and may include unintentional dictation errors.   Loleta Rose, MD 03/09/23 [phone]

## 2023-03-09 NOTE — ED Notes (Signed)
CT Tech Minerva Areola) and EDP at the bedside to discuss the patient needing a CT scan. Pt declines the scan and repeats "I just want to go home". Dr. York Cerise discussed at length the importance of the scan and to be thoroughly evaluated - Pt dismisses the providers information.This RN removed the patients IV and removed her monitoring equipment.

## 2023-03-09 NOTE — ED Triage Notes (Signed)
Pt presents ambulatory to triage via POV with complaints of generalized CP with taking deep breaths x 2 days. She also complains of a sharp headache for the last week- she notes taking Tylenol Pms without any improvement. Rates the headache a 10/10. A&Ox4 at this time. Denies vision changes, dizziness, or SOB.    167/118 99%  87 HR

## 2023-03-12 ENCOUNTER — Other Ambulatory Visit: Payer: Self-pay

## 2023-03-12 ENCOUNTER — Observation Stay: Payer: Medicaid Other

## 2023-03-12 ENCOUNTER — Encounter
Admission: EM | Disposition: A | Discharge status: Home / Self Care | Payer: Self-pay | Source: Home / Self Care | Attending: Internal Medicine

## 2023-03-12 ENCOUNTER — Other Ambulatory Visit (HOSPITAL_COMMUNITY): Payer: Self-pay

## 2023-03-12 ENCOUNTER — Inpatient Hospital Stay
Admission: EM | Admit: 2023-03-12 | Discharge: 2023-03-15 | DRG: 164 | Disposition: A | Discharge status: Home / Self Care | Payer: Medicaid Other | Attending: Internal Medicine | Admitting: Internal Medicine

## 2023-03-12 ENCOUNTER — Emergency Department: Payer: Medicaid Other

## 2023-03-12 ENCOUNTER — Inpatient Hospital Stay: Payer: Medicaid Other

## 2023-03-12 ENCOUNTER — Inpatient Hospital Stay
Admit: 2023-03-12 | Discharge: 2023-03-12 | Disposition: A | Discharge status: Home / Self Care | Payer: Medicaid Other | Attending: Internal Medicine | Admitting: Internal Medicine

## 2023-03-12 DIAGNOSIS — Z888 Allergy status to other drugs, medicaments and biological substances status: Secondary | ICD-10-CM | POA: Diagnosis not present

## 2023-03-12 DIAGNOSIS — E871 Hypo-osmolality and hyponatremia: Secondary | ICD-10-CM | POA: Diagnosis present

## 2023-03-12 DIAGNOSIS — I2609 Other pulmonary embolism with acute cor pulmonale: Principal | ICD-10-CM | POA: Diagnosis present

## 2023-03-12 DIAGNOSIS — R0600 Dyspnea, unspecified: Secondary | ICD-10-CM

## 2023-03-12 DIAGNOSIS — E876 Hypokalemia: Secondary | ICD-10-CM | POA: Diagnosis present

## 2023-03-12 DIAGNOSIS — J9811 Atelectasis: Secondary | ICD-10-CM | POA: Diagnosis present

## 2023-03-12 DIAGNOSIS — I2489 Other forms of acute ischemic heart disease: Secondary | ICD-10-CM | POA: Diagnosis present

## 2023-03-12 DIAGNOSIS — R519 Headache, unspecified: Secondary | ICD-10-CM | POA: Diagnosis present

## 2023-03-12 DIAGNOSIS — J90 Pleural effusion, not elsewhere classified: Secondary | ICD-10-CM | POA: Diagnosis present

## 2023-03-12 DIAGNOSIS — R0789 Other chest pain: Secondary | ICD-10-CM | POA: Diagnosis not present

## 2023-03-12 DIAGNOSIS — I5A Non-ischemic myocardial injury (non-traumatic): Secondary | ICD-10-CM | POA: Diagnosis not present

## 2023-03-12 DIAGNOSIS — I2699 Other pulmonary embolism without acute cor pulmonale: Secondary | ICD-10-CM | POA: Diagnosis present

## 2023-03-12 DIAGNOSIS — I160 Hypertensive urgency: Secondary | ICD-10-CM | POA: Diagnosis present

## 2023-03-12 DIAGNOSIS — Z716 Tobacco abuse counseling: Secondary | ICD-10-CM

## 2023-03-12 DIAGNOSIS — Z7901 Long term (current) use of anticoagulants: Secondary | ICD-10-CM | POA: Diagnosis not present

## 2023-03-12 DIAGNOSIS — F17210 Nicotine dependence, cigarettes, uncomplicated: Secondary | ICD-10-CM | POA: Diagnosis present

## 2023-03-12 DIAGNOSIS — I3139 Other pericardial effusion (noninflammatory): Secondary | ICD-10-CM | POA: Diagnosis present

## 2023-03-12 DIAGNOSIS — Z8249 Family history of ischemic heart disease and other diseases of the circulatory system: Secondary | ICD-10-CM | POA: Diagnosis not present

## 2023-03-12 DIAGNOSIS — R079 Chest pain, unspecified: Secondary | ICD-10-CM | POA: Diagnosis not present

## 2023-03-12 DIAGNOSIS — I10 Essential (primary) hypertension: Secondary | ICD-10-CM | POA: Diagnosis present

## 2023-03-12 DIAGNOSIS — F17200 Nicotine dependence, unspecified, uncomplicated: Secondary | ICD-10-CM | POA: Diagnosis present

## 2023-03-12 DIAGNOSIS — D509 Iron deficiency anemia, unspecified: Secondary | ICD-10-CM | POA: Diagnosis present

## 2023-03-12 DIAGNOSIS — D6862 Lupus anticoagulant syndrome: Secondary | ICD-10-CM | POA: Diagnosis present

## 2023-03-12 DIAGNOSIS — Z79899 Other long term (current) drug therapy: Secondary | ICD-10-CM

## 2023-03-12 HISTORY — DX: Non-ischemic myocardial injury (non-traumatic): I5A

## 2023-03-12 HISTORY — DX: Hypokalemia: E87.6

## 2023-03-12 HISTORY — DX: Essential (primary) hypertension: I10

## 2023-03-12 HISTORY — DX: Hypertensive urgency: I16.0

## 2023-03-12 HISTORY — PX: PULMONARY THROMBECTOMY: CATH118295

## 2023-03-12 LAB — COMPREHENSIVE METABOLIC PANEL
ALT: 10 U/L (ref 0–44)
AST: 23 U/L (ref 15–41)
Albumin: 3.3 g/dL — ABNORMAL LOW (ref 3.5–5.0)
Alkaline Phosphatase: 69 U/L (ref 38–126)
Anion gap: 9 (ref 5–15)
BUN: 10 mg/dL (ref 6–20)
CO2: 21 mmol/L — ABNORMAL LOW (ref 22–32)
Calcium: 8.9 mg/dL (ref 8.9–10.3)
Chloride: 102 mmol/L (ref 98–111)
Creatinine, Ser: 0.61 mg/dL (ref 0.44–1.00)
GFR, Estimated: >60 mL/min (ref >60)
Glucose, Bld: 102 mg/dL — ABNORMAL HIGH (ref 70–99)
Potassium: 3.2 mmol/L — ABNORMAL LOW (ref 3.5–5.1)
Sodium: 132 mmol/L — ABNORMAL LOW (ref 135–145)
Total Bilirubin: 0.3 mg/dL (ref 0.3–1.2)
Total Protein: 8.6 g/dL — ABNORMAL HIGH (ref 6.5–8.1)

## 2023-03-12 LAB — CBC
HCT: 31.7 % — ABNORMAL LOW (ref 36.0–46.0)
Hemoglobin: 10.3 g/dL — ABNORMAL LOW (ref 12.0–15.0)
MCH: 25.7 pg — ABNORMAL LOW (ref 26.0–34.0)
MCHC: 32.5 g/dL (ref 30.0–36.0)
MCV: 79.1 fL — ABNORMAL LOW (ref 80.0–100.0)
Platelets: 260 10*3/uL (ref 150–400)
RBC: 4.01 MIL/uL (ref 3.87–5.11)
RDW: 16.5 % — ABNORMAL HIGH (ref 11.5–15.5)
WBC: 9.1 10*3/uL (ref 4.0–10.5)
nRBC: 0 % (ref 0.0–0.2)

## 2023-03-12 LAB — ECHOCARDIOGRAM COMPLETE
Height: 60 in
Weight: 2356.28 oz

## 2023-03-12 LAB — TROPONIN I (HIGH SENSITIVITY)
Troponin I (High Sensitivity): 18 ng/L — ABNORMAL HIGH (ref <18)
Troponin I (High Sensitivity): 20 ng/L — ABNORMAL HIGH (ref <18)
Troponin I (High Sensitivity): 20 ng/L — ABNORMAL HIGH (ref <18)
Troponin I (High Sensitivity): 74 ng/L — ABNORMAL HIGH (ref <18)

## 2023-03-12 LAB — PROCALCITONIN: Procalcitonin: <0.1 ng/mL

## 2023-03-12 LAB — LIPID PANEL
Cholesterol: 127 mg/dL (ref 0–200)
HDL: 30 mg/dL — ABNORMAL LOW (ref >40)
LDL Cholesterol: 77 mg/dL (ref 0–99)
Total CHOL/HDL Ratio: 4.2 RATIO
Triglycerides: 99 mg/dL (ref <150)
VLDL: 20 mg/dL (ref 0–40)

## 2023-03-12 LAB — APTT: aPTT: 59 seconds — ABNORMAL HIGH (ref 24–36)

## 2023-03-12 LAB — ANTITHROMBIN III: AntiThromb III Func: 114 % (ref 75–120)

## 2023-03-12 LAB — HIV ANTIBODY (ROUTINE TESTING W REFLEX): HIV Screen 4th Generation wRfx: NONREACTIVE

## 2023-03-12 LAB — MAGNESIUM: Magnesium: 1.9 mg/dL (ref 1.7–2.4)

## 2023-03-12 LAB — SEDIMENTATION RATE: Sed Rate: 76 mm/hr — ABNORMAL HIGH (ref 0–20)

## 2023-03-12 LAB — PROTIME-INR
INR: 1.2 (ref 0.8–1.2)
Prothrombin Time: 15.6 seconds — ABNORMAL HIGH (ref 11.4–15.2)

## 2023-03-12 LAB — LACTATE DEHYDROGENASE: LDH: 135 U/L (ref 98–192)

## 2023-03-12 LAB — BRAIN NATRIURETIC PEPTIDE: B Natriuretic Peptide: 62.9 pg/mL (ref 0.0–100.0)

## 2023-03-12 LAB — C-REACTIVE PROTEIN: CRP: 7.4 mg/dL — ABNORMAL HIGH (ref <1.0)

## 2023-03-12 SURGERY — PULMONARY THROMBECTOMY
Anesthesia: Moderate Sedation

## 2023-03-12 MED ORDER — LABETALOL HCL 5 MG/ML IV SOLN
10.0000 mg | Freq: Once | INTRAVENOUS | Status: AC
  Administered 2023-03-12: 10 mg via INTRAVENOUS
  Filled 2023-03-12: qty 4

## 2023-03-12 MED ORDER — MIDAZOLAM HCL 2 MG/2ML IJ SOLN
INTRAMUSCULAR | Status: DC | PRN
  Administered 2023-03-12: 2 mg via INTRAVENOUS
  Administered 2023-03-12: 1 mg via INTRAVENOUS

## 2023-03-12 MED ORDER — LIDOCAINE-EPINEPHRINE (PF) 1 %-1:200000 IJ SOLN
INTRAMUSCULAR | Status: DC | PRN
  Administered 2023-03-12: 10 mL

## 2023-03-12 MED ORDER — DIPHENHYDRAMINE HCL 50 MG/ML IJ SOLN
INTRAMUSCULAR | Status: AC
  Filled 2023-03-12: qty 1

## 2023-03-12 MED ORDER — DIPHENHYDRAMINE HCL 50 MG/ML IJ SOLN: 50.0000 mg | Freq: Once | INTRAMUSCULAR | Status: DC | PRN

## 2023-03-12 MED ORDER — CEFAZOLIN SODIUM-DEXTROSE 2-4 GM/100ML-% IV SOLN
2.0000 g | INTRAVENOUS | Status: AC
  Administered 2023-03-12: 2 g via INTRAVENOUS

## 2023-03-12 MED ORDER — AMLODIPINE BESYLATE 5 MG PO TABS
5.0000 mg | ORAL_TABLET | Freq: Every day | ORAL | Status: DC
  Administered 2023-03-12 – 2023-03-15 (×4): 5 mg via ORAL
  Filled 2023-03-12 (×4): qty 1

## 2023-03-12 MED ORDER — ONDANSETRON HCL 4 MG/2ML IJ SOLN: 4.0000 mg | Freq: Three times a day (TID) | INTRAMUSCULAR | Status: DC | PRN

## 2023-03-12 MED ORDER — OXYCODONE-ACETAMINOPHEN 5-325 MG PO TABS
1.0000 | ORAL_TABLET | ORAL | Status: DC | PRN
  Administered 2023-03-12 – 2023-03-15 (×12): 1 via ORAL
  Filled 2023-03-12 (×12): qty 1

## 2023-03-12 MED ORDER — FENTANYL CITRATE (PF) 100 MCG/2ML IJ SOLN
INTRAMUSCULAR | Status: DC | PRN
  Administered 2023-03-12: 50 ug via INTRAVENOUS
  Administered 2023-03-12: 25 ug via INTRAVENOUS

## 2023-03-12 MED ORDER — FENTANYL CITRATE PF 50 MCG/ML IJ SOSY: 12.5000 ug | PREFILLED_SYRINGE | Freq: Once | INTRAMUSCULAR | Status: DC | PRN

## 2023-03-12 MED ORDER — HYDRALAZINE HCL 20 MG/ML IJ SOLN: 5.0000 mg | INTRAMUSCULAR | Status: DC | PRN

## 2023-03-12 MED ORDER — MIDAZOLAM HCL 2 MG/ML PO SYRP: 8.0000 mg | ORAL_SOLUTION | Freq: Once | ORAL | Status: DC | PRN

## 2023-03-12 MED ORDER — SODIUM CHLORIDE 0.9 % IV SOLN: INTRAVENOUS | Status: DC

## 2023-03-12 MED ORDER — KETOROLAC TROMETHAMINE 30 MG/ML IJ SOLN
15.0000 mg | Freq: Once | INTRAMUSCULAR | Status: AC
  Administered 2023-03-12: 15 mg via INTRAVENOUS
  Filled 2023-03-12: qty 1

## 2023-03-12 MED ORDER — HEPARIN BOLUS VIA INFUSION
4500.0000 [IU] | Freq: Once | INTRAVENOUS | Status: AC
  Administered 2023-03-12: 4500 [IU] via INTRAVENOUS
  Filled 2023-03-12: qty 4500

## 2023-03-12 MED ORDER — FUROSEMIDE 10 MG/ML IJ SOLN
60.0000 mg | Freq: Once | INTRAMUSCULAR | Status: AC
  Administered 2023-03-12: 60 mg via INTRAVENOUS
  Filled 2023-03-12: qty 8

## 2023-03-12 MED ORDER — IOHEXOL 350 MG/ML SOLN
75.0000 mL | Freq: Once | INTRAVENOUS | Status: AC | PRN
  Administered 2023-03-12: 55 mL via INTRAVENOUS

## 2023-03-12 MED ORDER — NICOTINE 21 MG/24HR TD PT24: 21.0000 mg | MEDICATED_PATCH | Freq: Every day | TRANSDERMAL | Status: DC

## 2023-03-12 MED ORDER — ALTEPLASE 1 MG/ML SYRINGE FOR VASCULAR PROCEDURE
INTRAMUSCULAR | Status: DC | PRN
  Administered 2023-03-12: 6 mg via INTRA_ARTERIAL

## 2023-03-12 MED ORDER — ENOXAPARIN SODIUM 40 MG/0.4ML IJ SOSY: 40.0000 mg | PREFILLED_SYRINGE | INTRAMUSCULAR | Status: DC

## 2023-03-12 MED ORDER — HEPARIN (PORCINE) 25000 UT/250ML-% IV SOLN
1100.0000 [IU]/h | INTRAVENOUS | Status: DC
  Administered 2023-03-12: 1050 [IU]/h via INTRAVENOUS
  Administered 2023-03-13: 1100 [IU]/h via INTRAVENOUS
  Filled 2023-03-12 (×2): qty 250

## 2023-03-12 MED ORDER — ACETAMINOPHEN 325 MG PO TABS: 650.0000 mg | ORAL_TABLET | Freq: Four times a day (QID) | ORAL | Status: DC | PRN

## 2023-03-12 MED ORDER — MORPHINE SULFATE (PF) 2 MG/ML IV SOLN
INTRAVENOUS | Status: AC
  Administered 2023-03-12: 2 mg via INTRAVENOUS
  Filled 2023-03-12: qty 1

## 2023-03-12 MED ORDER — LORAZEPAM 1 MG PO TABS
1.0000 mg | ORAL_TABLET | Freq: Once | ORAL | Status: AC
  Administered 2023-03-12: 1 mg via ORAL
  Filled 2023-03-12: qty 1

## 2023-03-12 MED ORDER — NITROGLYCERIN 0.4 MG SL SUBL: 0.4000 mg | SUBLINGUAL_TABLET | SUBLINGUAL | Status: DC | PRN

## 2023-03-12 MED ORDER — FAMOTIDINE 20 MG PO TABS: 40.0000 mg | ORAL_TABLET | Freq: Once | ORAL | Status: DC | PRN

## 2023-03-12 MED ORDER — MIDAZOLAM HCL 5 MG/5ML IJ SOLN
INTRAMUSCULAR | Status: AC
  Filled 2023-03-12: qty 5

## 2023-03-12 MED ORDER — ONDANSETRON HCL 4 MG/2ML IJ SOLN: 4.0000 mg | Freq: Four times a day (QID) | INTRAMUSCULAR | Status: DC | PRN

## 2023-03-12 MED ORDER — CEFAZOLIN SODIUM-DEXTROSE 2-4 GM/100ML-% IV SOLN
INTRAVENOUS | Status: AC
  Filled 2023-03-12: qty 100

## 2023-03-12 MED ORDER — ASPIRIN 81 MG PO TBEC
81.0000 mg | DELAYED_RELEASE_TABLET | Freq: Every day | ORAL | Status: DC
  Administered 2023-03-13 – 2023-03-15 (×3): 81 mg via ORAL
  Filled 2023-03-12 (×3): qty 1

## 2023-03-12 MED ORDER — IODIXANOL 320 MG/ML IV SOLN
INTRAVENOUS | Status: DC | PRN
  Administered 2023-03-12: 60 mL

## 2023-03-12 MED ORDER — METHYLPREDNISOLONE SODIUM SUCC 125 MG IJ SOLR: 125.0000 mg | Freq: Once | INTRAMUSCULAR | Status: DC | PRN

## 2023-03-12 MED ORDER — FENTANYL CITRATE (PF) 100 MCG/2ML IJ SOLN
INTRAMUSCULAR | Status: AC
  Filled 2023-03-12: qty 2

## 2023-03-12 MED ORDER — MORPHINE SULFATE (PF) 2 MG/ML IV SOLN
2.0000 mg | INTRAVENOUS | Status: DC | PRN
  Administered 2023-03-12 – 2023-03-13 (×2): 2 mg via INTRAVENOUS
  Filled 2023-03-12 (×2): qty 1

## 2023-03-12 MED ORDER — HEPARIN (PORCINE) IN NACL 1000-0.9 UT/500ML-% IV SOLN
INTRAVENOUS | Status: DC | PRN
  Administered 2023-03-12: 1000 mL

## 2023-03-12 MED ORDER — DIPHENHYDRAMINE HCL 50 MG/ML IJ SOLN
INTRAMUSCULAR | Status: DC | PRN
  Administered 2023-03-12: 25 mg via INTRAVENOUS

## 2023-03-12 MED ORDER — HEPARIN SODIUM (PORCINE) 1000 UNIT/ML IJ SOLN
INTRAMUSCULAR | Status: AC
  Filled 2023-03-12: qty 10

## 2023-03-12 MED ORDER — HYDROMORPHONE HCL 1 MG/ML IJ SOLN: 1.0000 mg | Freq: Once | INTRAMUSCULAR | Status: DC | PRN

## 2023-03-12 MED ORDER — POTASSIUM CHLORIDE CRYS ER 20 MEQ PO TBCR
60.0000 meq | EXTENDED_RELEASE_TABLET | Freq: Once | ORAL | Status: AC
  Administered 2023-03-12: 60 meq via ORAL
  Filled 2023-03-12: qty 3

## 2023-03-12 SURGICAL SUPPLY — 18 items
CANISTER PENUMBRA ENGINE (MISCELLANEOUS) IMPLANT
CANNULA 5F STIFF (CANNULA) IMPLANT
CATH ANGIO 5F PIGTAIL 100CM (CATHETERS) IMPLANT
CATH INDIGO SEP 8 (CATHETERS) IMPLANT
CATH LIGHTNING 8 XTORQ 115 (CATHETERS) IMPLANT
CATH SELECT BERN TIP 5F 130 (CATHETERS) IMPLANT
CLOSURE PERCLOSE PROSTYLE (VASCULAR PRODUCTS) IMPLANT
COVER PROBE ULTRASOUND 5X96 (MISCELLANEOUS) IMPLANT
GLIDEWIRE ADV .035X180CM (WIRE) IMPLANT
PACK ANGIOGRAPHY (CUSTOM PROCEDURE TRAY) ×1 IMPLANT
SHEATH 9FRX11 (SHEATH) IMPLANT
SHEATH BRITE TIP 6FRX11 (SHEATH) IMPLANT
SUT MNCRL AB 4-0 PS2 18 (SUTURE) IMPLANT
SUT PROLENE 0 CT 1 30 (SUTURE) IMPLANT
SYR MEDRAD MARK 7 150ML (SYRINGE) IMPLANT
TUBING CONTRAST HIGH PRESS 72 (TUBING) IMPLANT
WIRE GUIDERIGHT .035X150 (WIRE) IMPLANT
WIRE SUPRACORE 300CM (WIRE) IMPLANT

## 2023-03-12 NOTE — Consult Note (Signed)
Hospital Consult    Reason for Consult:  Pulmonary Embolism with right heart strain Requesting Physician:  Dr Lorretta Harp MD MRN #:  [id number]  History of Present Illness: This is a 37 y.o. female with medical history significant of HTN, anemia, smoker, who presents with chest pain.  Pt states that she has chest pain for more than 5 days. It is located in the front chest and right flank area, constant, sharp, 10 out of 10 in severity, nonradiating, pleuritic, aggravated by deep breath and coughing.  No fever or chills.  She has shortness of breath and dry cough.  No fall or injury.  Patient denies nausea, vomiting, diarrhea or abdominal pain.  No symptoms of UTI.   On exam this morning the patient is resting comfortably in bed. Heparin is infusing. She endorses right sided chest pain on deep inspiration. Intermittent cough today. Bilateral lower extremities are warm to touch. She denies any pain on palpation. She denies any long periods of sitting or traveling out of the country. Vascular Surgery consulted for pulmonary thrombectomy.    Past Medical History:  Diagnosis Date   Anemia    Hemorrhoid    HTN (hypertension)     Past Surgical History:  Procedure Laterality Date   CESAREAN SECTION      Allergies  Allergen Reactions   Other Itching    Pt reports had allergic reaction to something she was given for pain during birth labor, but pt reports unsure of name of medication.    Prior to Admission medications   Medication Sig Start Date End Date Taking? Authorizing Provider  hydrochlorothiazide (HYDRODIURIL) 25 MG tablet Take 1 tablet (25 mg total) by mouth daily. Patient not taking: Reported on 03/12/2023 02/03/23   Chinita Pester, FNP  magic mouthwash w/lidocaine SOLN Take 10 mLs by mouth 4 (four) times daily as needed for mouth pain. 30ml diphenhydramine; 30ml Maalox; 20ml Lidocaine Patient not taking: Reported on 03/12/2023 02/03/23   Kem Boroughs B, FNP  methocarbamol (ROBAXIN)  500 MG tablet Take 1 tablet (500 mg total) by mouth every 8 (eight) hours as needed for muscle spasms. Patient not taking: Reported on 03/12/2023 12/27/22   Jene Every, MD  metroNIDAZOLE (FLAGYL) 500 MG tablet Take 1 tablet (500 mg total) by mouth 2 (two) times daily. Patient not taking: Reported on 03/12/2023 04/14/22   Glenna Fellows, FNP  naproxen (NAPROSYN) 500 MG tablet Take 1 tablet (500 mg total) by mouth 2 (two) times daily with a meal. Patient not taking: Reported on 03/12/2023 02/03/23   Kem Boroughs B, FNP  ondansetron (ZOFRAN-ODT) 4 MG disintegrating tablet Take 1 tablet (4 mg total) by mouth every 8 (eight) hours as needed. Patient not taking: Reported on 04/14/2022 09/18/21   Delton Prairie, MD    Social History   Socioeconomic History   Marital status: Single    Spouse name: Not on file   Number of children: Not on file   Years of education: Not on file   Highest education level: Not on file  Occupational History   Not on file  Tobacco Use   Smoking status: Every Day    Current packs/day: 0.00    Types: Cigarettes, E-cigarettes    Last attempt to quit: 2021    Years since quitting: 3.5   Smokeless tobacco: Never  Vaping Use   Vaping status: Every Day   Substances: Flavoring  Substance and Sexual Activity   Alcohol use: Not Currently    Alcohol/week: 1.0 standard  drink of alcohol    Types: 1 Shots of liquor per week    Comment: socially maybe once amonth   Drug use: Not Currently    Types: Marijuana    Comment: last use age 75   Sexual activity: Yes    Partners: Male    Birth control/protection: None  Other Topics Concern   Not on file  Social History Narrative   Not on file   Social Determinants of Health   Financial Resource Strain: Not on file  Food Insecurity: No Food Insecurity (03/12/2023)   Hunger Vital Sign    Worried About Running Out of Food in the Last Year: Never true    Ran Out of Food in the Last Year: Never true  Transportation Needs: No  Transportation Needs (03/12/2023)   PRAPARE - Administrator, Civil Service (Medical): No    Lack of Transportation (Non-Medical): No  Physical Activity: Not on file  Stress: Not on file  Social Connections: Not on file  Intimate Partner Violence: Patient Declined (03/12/2023)   Humiliation, Afraid, Rape, and Kick questionnaire    Fear of Current or Ex-Partner: Patient declined    Emotionally Abused: Patient declined    Physically Abused: Patient declined    Sexually Abused: Patient declined     Family History  Problem Relation Age of Onset   Diabetes Mother    Hypertension Mother    Diabetes Mellitus II Maternal Grandmother    Diabetes Mellitus II Maternal Grandfather     ROS: Otherwise negative unless mentioned in HPI  Physical Examination  Vitals:   03/12/23 0930 03/12/23 1050  BP:  (!) 151/112  Pulse:  (!) 106  Resp: 18 18  Temp:  97.6 F (36.4 C)  SpO2: 100% 99%   Body mass index is 28.76 kg/m.  General:  WDWN in NAD Gait: Not observed HENT: WNL, normocephalic Pulmonary: normal non-labored breathing, without Rales, rhonchi,  wheezing Cardiac: regular, without  Murmurs, rubs or gallops; without carotid bruits Abdomen: Positive bowel sounds, soft, NT/ND, no masses Skin: without rashes Vascular Exam/Pulses: Palpable pulses throughout, all extremities are warm to touch with palpable pulses. Extremities: without ischemic changes, without Gangrene , without cellulitis; without open wounds;  Musculoskeletal: no muscle wasting or atrophy  Neurologic: A&O X 3;  No focal weakness or paresthesias are detected; speech is fluent/normal Psychiatric:  The pt has Normal affect. Lymph:  Unremarkable  CBC    Component Value Date/Time   WBC 9.1 03/12/2023 0720   RBC 4.01 03/12/2023 0720   HGB 10.3 (L) 03/12/2023 0720   HGB 9.3 (L) 10/27/2011 1418   HCT 31.7 (L) 03/12/2023 0720   HCT 29.0 (L) 10/27/2011 1418   PLT 260 03/12/2023 0720   PLT 249 10/27/2011 1418    MCV 79.1 (L) 03/12/2023 0720   MCV 75 (L) 10/27/2011 1418   MCH 25.7 (L) 03/12/2023 0720   MCHC 32.5 03/12/2023 0720   RDW 16.5 (H) 03/12/2023 0720   RDW 15.6 (H) 10/27/2011 1418   LYMPHSABS 1.0 05/23/2015 1210   MONOABS 1.6 (H) 05/23/2015 1210   EOSABS 0.1 05/23/2015 1210   BASOSABS 0.0 05/23/2015 1210    BMET    Component Value Date/Time   NA 132 (L) 03/12/2023 0720   NA 137 10/27/2011 1418   K 3.2 (L) 03/12/2023 0720   K 4.2 10/27/2011 1418   CL 102 03/12/2023 0720   CL 105 10/27/2011 1418   CO2 21 (L) 03/12/2023 0720   CO2 26  10/27/2011 1418   GLUCOSE 102 (H) 03/12/2023 0720   GLUCOSE 82 10/27/2011 1418   BUN 10 03/12/2023 0720   BUN 16 10/27/2011 1418   CREATININE 0.61 03/12/2023 0720   CREATININE 0.77 10/27/2011 1418   CALCIUM 8.9 03/12/2023 0720   CALCIUM 9.6 10/27/2011 1418   GFRNONAA >60 03/12/2023 0720   GFRNONAA >60 10/27/2011 1418   GFRAA >60 09/10/2019 1253   GFRAA >60 10/27/2011 1418    COAGS: No results found for: "INR", "PROTIME"   Non-Invasive Vascular Imaging:   EXAM:03/12/23 CT ANGIOGRAPHY CHEST WITH CONTRAST   TECHNIQUE: Multidetector CT imaging of the chest was performed using the standard protocol during bolus administration of intravenous contrast. Multiplanar CT image reconstructions and MIPs were obtained to evaluate the vascular anatomy.   RADIATION DOSE REDUCTION: This exam was performed according to the departmental dose-optimization program which includes automated exposure control, adjustment of the mA and/or kV according to patient size and/or use of iterative reconstruction technique.   CONTRAST:  55mL OMNIPAQUE IOHEXOL 350 MG/ML SOLN   COMPARISON:  None Available.   FINDINGS: Cardiovascular: Acute-appearing pulmonary embolism, nearly occlusive, within the central segmental pulmonary artery branches to the lingula (series 7, images 169-175).   Additional partially occlusive pulmonary embolism within a  central segmental pulmonary artery branch to the LEFT upper lobe (series 7, images 137 through 140).   Questionable additional small/focal nonocclusive pulmonary embolism within a peripheral segmental pulmonary branch to the posterior LEFT lower lobe (series 7, image 225).   No pulmonary embolism is seen within the LEFT main pulmonary artery. No pulmonary emboli are identified within the main, lobar or segmental pulmonary arteries to the RIGHT lung.   RV/LV Ratio = greater than 1   Small pericardial effusion. No thoracic aortic aneurysm or evidence of aortic dissection.   Mediastinum/Nodes: No mass or enlarged lymph nodes are appreciated within the mediastinum, perihilar or axillary regions. Esophagus is unremarkable. Trachea and central bronchi are unremarkable.   Lungs/Pleura: RIGHT pleural effusion, small to moderate in size. RIGHT lower lobe consolidation is likely associated compressive atelectasis. RIGHT lung appears otherwise clear.   Mild atelectasis and small pleural effusion at the LEFT lung base. LEFT lung is otherwise clear. No pneumothorax is seen.   Upper Abdomen: Limited images of the upper abdomen are unremarkable.   Musculoskeletal: No chest wall abnormality. No acute or significant osseous findings.   Review of the MIP images confirms the above findings.   IMPRESSION: 1. Multiple acute-appearing pulmonary emboli within the LEFT lung, with involvement of the LEFT upper lobe and lingula, and with probable involvement of the LEFT lower lobe, largest pulmonary emboli being occlusive or nearly occlusive, as detailed above. 2. No RIGHT-sided pulmonary emboli identified. 3. RV/LV ratio = greater than 1, suggesting right heart strain. The presence of right heart strain has been associated with an increased risk of morbidity and mortality. Please refer to the "Code PE Focused" order set in EPIC. 4. RIGHT pleural effusion, small to moderate in size. Adjacent  RIGHT lower lobe consolidation is likely associated compressive atelectasis, less likely pneumonia. 5. Small LEFT pleural effusion and associated mild atelectasis. 6. Small pericardial effusion.    Statin:  No. Beta Blocker:  No. Aspirin:  No. ACEI:  No. ARB:  No. CCB use:  No Other antiplatelets/anticoagulants:  No.    ASSESSMENT/PLAN: This is a 37 y.o. female who presents to [facility] ER with right sided chest pain and cough. This started more than 5 days ago and is sharp  and aggravated by deep breathing and coughing. On workup she underwent a CTA of the chest revealing pulmonary embolisms with right sided heart strain.   Vascular Surgery plans on taking the patient to the vascular lab for a pulmonary thrombectomy this afternoon 03/12/23. I discussed in detail with the patient the procedure, benefits, complications and risks. She verbalizes her understanding and wishes to proceed as soon as possible. I answered all of her questions. Patient reports she has been NPO since midnight last night.      -I discussed the plan in detail with Dr Festus Barren MD and he agrees with the plan.    Marcie Bal [facility] 03/12/2023 11:25 AM

## 2023-03-12 NOTE — Consult Note (Signed)
ANTICOAGULATION CONSULT NOTE - Initial Consult  Pharmacy Consult for Heparin Indication: pulmonary embolus  Allergies  Allergen Reactions   Other Itching    Pt reports had allergic reaction to something she was given for pain during birth labor, but pt reports unsure of name of medication.    Patient Measurements: Height: 5' (152.4 cm) Weight: 66.8 kg (147 lb 4.3 oz) IBW/kg (Calculated) : 45.5 Heparin Dosing Weight: 59.9 kg  Vital Signs: Temp: 97.6 F (36.4 C) (07/12 1050) Temp Source: Oral (07/12 0654) BP: 151/112 (07/12 1050) Pulse Rate: 106 (07/12 1050)  Labs: Recent Labs    03/12/23 0720 03/12/23 0855  HGB 10.3*  --   HCT 31.7*  --   PLT 260  --   CREATININE 0.61  --   TROPONINIHS 18* 20*    Estimated Creatinine Clearance: 84.5 mL/min (by C-G formula based on SCr of 0.61 mg/dL).   Medical History: Past Medical History:  Diagnosis Date   Anemia    Hemorrhoid    HTN (hypertension)     Medications:  Facility-Administered Medications Prior to Admission  Medication Dose Route Frequency Provider Last Rate Last Admin   medroxyPROGESTERone (DEPO-PROVERA) injection 150 mg  150 mg Intramuscular Q90 days Glenna Fellows, FNP       Medications Prior to Admission  Medication Sig Dispense Refill Last Dose   hydrochlorothiazide (HYDRODIURIL) 25 MG tablet Take 1 tablet (25 mg total) by mouth daily. (Patient not taking: Reported on 03/12/2023) 30 tablet 0 Not Taking   magic mouthwash w/lidocaine SOLN Take 10 mLs by mouth 4 (four) times daily as needed for mouth pain. 30ml diphenhydramine; 30ml Maalox; 20ml Lidocaine (Patient not taking: Reported on 03/12/2023) 80 mL 0 Not Taking   methocarbamol (ROBAXIN) 500 MG tablet Take 1 tablet (500 mg total) by mouth every 8 (eight) hours as needed for muscle spasms. (Patient not taking: Reported on 03/12/2023) 20 tablet 1 Completed Course   metroNIDAZOLE (FLAGYL) 500 MG tablet Take 1 tablet (500 mg total) by mouth 2 (two) times daily.  (Patient not taking: Reported on 03/12/2023) 14 tablet 0 Completed Course   naproxen (NAPROSYN) 500 MG tablet Take 1 tablet (500 mg total) by mouth 2 (two) times daily with a meal. (Patient not taking: Reported on 03/12/2023) 30 tablet 0 Completed Course   ondansetron (ZOFRAN-ODT) 4 MG disintegrating tablet Take 1 tablet (4 mg total) by mouth every 8 (eight) hours as needed. (Patient not taking: Reported on 04/14/2022) 20 tablet 0 Completed Course   Scheduled:   amLODipine  5 mg Oral Daily   [START ON 03/13/2023] aspirin EC  81 mg Oral Daily   Infusions:  PRN: acetaminophen, hydrALAZINE, morphine injection, nitroGLYCERIN, ondansetron (ZOFRAN) IV, oxyCODONE-acetaminophen Anti-infectives (From admission, onward)    None       Assessment: 37 y.o. female with medical history significant of HTN, anemia, smoker, who presents with chest pain.   Chest CT:  Multiple acute-appearing pulmonary emboli within the LEFT lung, with involvement of the LEFT upper lobe and lingula, and with probable involvement of the LEFT lower lobe, largest pulmonary emboli being occlusive or nearly occlusive, as detailed above. RV//LV > 1 suggesting suggesting right heart strain.   Goal of Therapy:  Heparin level 0.3-0.7 units/ml Monitor platelets by anticoagulation protocol: Yes   Plan:  Give 4500 units bolus x 1 Start heparin infusion at 1050 units/hr Check anti-Xa level in 6 hours and daily while on heparin Continue to monitor H&H and platelets Plan for pulm thrombectomy.   [employer]  S Rahsaan Weakland 03/12/2023,11:02 AM

## 2023-03-12 NOTE — Op Note (Signed)
[facility]  Percutaneous Study/Intervention Procedural Note   Date of Surgery: 03/12/2023,5:04 PM  Surgeon: Festus Barren  Pre-operative Diagnosis: Symptomatic pulmonary emboli  Post-operative diagnosis:  Same  Procedure(s) Performed:  1.  Contrast injection right heart  2.  Thrombolysis with 6 mg of tPA to the left pulmonary arteries  3.  Mechanical thrombectomy of the left upper lobe and left lower lobe pulmonary arteries using the penumbra CAT 8 catheter  4.  Selective catheter placement right main pulmonary artery  5.  Selective catheter placement left lower lobe and upper lobe pulmonary arteries    Anesthesia: Conscious sedation was administered under my direct supervision by the interventional radiology RN. IV Versed plus fentanyl were utilized. Continuous ECG, pulse oximetry and blood pressure was monitored throughout the entire procedure.  Versed and fentanyl were administered intravenously.  Conscious sedation was administered for a total of 41 minutes using 3 mg of Versed and 75 mcg of Fentanyl.  EBL: 200 cc  Sheath: 9 French right femoral vein  Contrast: 60 cc   Fluoroscopy Time: 6.4 minutes  Indications:  Patient presents with pulmonary emboli. The patient is symptomatic with hypoxemia and dyspnea on exertion.  There is evidence of right heart strain on the CT angiogram. The patient is otherwise a good candidate for intervention and even the long-term benefits pulmonary angiography with thrombolysis is offered. The risks and benefits are reviewed long-term benefits are discussed. All questions are answered patient agrees to proceed.  Procedure:  Andrea Huang a 37 y.o. female who was identified and appropriate procedural time out was performed.  The patient was then placed supine on the table and prepped and draped in the usual sterile fashion.  Ultrasound was used to evaluate the right common femoral vein.  It was patent, as it was echolucent  and compressible.  A digital ultrasound image was acquired for the permanent record.  A Seldinger needle was used to access the right common femoral vein under direct ultrasound guidance.  A 0.035 J wire was advanced without resistance and a 5Fr sheath was placed.  I then placed a ProGlide device in a preclose fashion and then upsized to an 9 [place] sheath.    The wire and pigtail catheter were then negotiated into the right atrium and bolus injection of contrast was utilized to demonstrate the right ventricle and the pulmonary artery outflow. The wire and pigtail catheter were then negotiated into the right main pulmonary artery where hand injection of contrast was utilized to image the right side.  No pulmonary embolus was seen in the right main, upper, middle, or lower lobe pulmonary arteries.  I then transition to the left side to evaluate the left lung.  The left lower lobe was cannulated first followed by the left upper lobe pulmonary artery where selective imaging is performed. Several branches in the left lower lobe including the lingula branch demonstrated pulmonary embolus.  There is also a small to medium amount of pulmonary embolus in the left upper lobe pulmonary artery.  TPA was reconstituted and delivered onto the table. A total of 6 milligrams of TPA was utilized, all on the left side.   The Penumbra Cat 8 catheter was then advanced up into the pulmonary vasculature. The left lung was addressed with thrombectomy. Catheter was negotiated into the left lower lobe and mechanical thrombectomy was performed to the affected branches using the separator. Follow-up imaging demonstrated a good result and therefore the catheter was renegotiated into the left  upper lobe pulmonary artery and again mechanical thrombectomy was performed. Passes were made with both the Penumbra catheter itself as well as introducing the separator. Follow-up imaging was then performed.   After review these images wires were  reintroduced and the catheters removed. Then, the sheath is then pulled, the Pro-glide device is secured and a 4-0 Monocryl was placed on the skin and pressures held. A sterile dressing is placed.    Findings:   Right heart imaging:  Right atrium and right ventricle and the pulmonary outflow tract appears dilated  Right lung: No pulmonary embolus was seen in the right main, upper, middle, or lower lobe pulmonary arteries  Left lung: Several branches in the left lower lobe including the lingula branch demonstrated pulmonary embolus.  There is also a small to medium amount of pulmonary embolus in the left upper lobe pulmonary artery.    Disposition: Patient was taken to the recovery room in stable condition having tolerated the procedure well.  Festus Barren 03/12/2023,5:04 PM

## 2023-03-12 NOTE — TOC Benefit Eligibility Note (Signed)
Pharmacy Patient Advocate Encounter  Insurance verification completed.    The patient is insured through [employer]   Ran test claim for Eliquis 5 mg and the current 30 day co-pay is $4.00.  Ran test claim for Xarelto 20 mg and the current 30 day co-pay is $4.00.   This test claim was processed through [facility]- copay amounts may vary at other pharmacies due to pharmacy/plan contracts, or as the patient moves through the different stages of their insurance plan.    Katherina Wimer, CPHT Pharmacy Patient Advocate Specialist [facility] Patient Advocate Team Direct Number: [phone]  Fax: [phone] 

## 2023-03-12 NOTE — Interval H&P Note (Signed)
History and Physical Interval Note:  03/12/2023 1:10 PM  Andrea Huang  has presented today for surgery, with the diagnosis of Pulmonary Embolism.  The various methods of treatment have been discussed with the patient and family. After consideration of risks, benefits and other options for treatment, the patient has consented to  Procedure(s): PULMONARY THROMBECTOMY (N/A) as a surgical intervention.  The patient's history has been reviewed, patient examined, no change in status, stable for surgery.  I have reviewed the patient's chart and labs.  Questions were answered to the patient's satisfaction.     Festus Barren

## 2023-03-12 NOTE — H&P (Addendum)
History and Physical    Andrea Huang:[id number] DOB: 01/04/1986 DOA: 03/12/2023  Referring MD/NP/PA:   PCP: [facility]   Patient coming from:  The patient is coming from home.     Chief Complaint: chest pain  HPI: Andrea Huang is a 37 y.o. female with medical history significant of HTN, anemia, smoker, who presents with chest pain  Pt states that she has chest pain for more than 5 days. It is located in the front chest and right flank area, constant, sharp, 10 out of 10 in severity, nonradiating, pleuritic, aggravated by deep breath and coughing.  No fever or chills.  She has shortness of breath and dry cough.  No fall or injury.  Patient denies nausea, vomiting, diarrhea or abdominal pain.  No symptoms of UTI.  She had right sided headache initially, which was 10 out of 10 in severity, currently resolved, no headache when I saw patient in ED. Patient was found to have elevated blood pressure 183/126 in ED.  She states that she has history of hypertension, but not taking blood pressure medications. Patient was seen in ED for the same on 7/9, but left hospital AMA.  Chest x-ray on 7/9 showed bilateral pleural effusion.  Data reviewed independently and ED Course: pt was found to have WBC 9.1, BNP 6.9, troponin 18, potassium 3.2, GFR> 60.  Temperature normal, blood pressure 193/126, which improved to 146/111 after giving 10 mg of labetalol in ED, heart rate 102, 88, RR 43, 22, oxygen saturation 98% on room air.  Chest x-ray showed bilateral small to moderate pleural effusion.  Patient is placed on telemetry bed for observation.  CXR: Small to moderate bilateral pleural effusions with associated bibasilar atelectasis versus infiltrates. Findings are progressed compared to the x-ray dated 03/09/2023.   EKG: I have personally reviewed.  Sinus rhythm, QTc 417, LAE, right axis deviation, poor R wave progression.   Review of Systems:   General: no fevers,  chills, no body weight gain, has fatigue and HA HEENT: no blurry vision, hearing changes or sore throat Respiratory: has dyspnea, coughing, no wheezing CV: has chest pain, no palpitations GI: no nausea, vomiting, abdominal pain, diarrhea, constipation GU: no dysuria, burning on urination, increased urinary frequency, hematuria  Ext: no leg edema Neuro: no unilateral weakness, numbness, or tingling, no vision change or hearing loss Skin: no rash, no skin tear. MSK: No muscle spasm, no deformity, no limitation of range of movement in spin Heme: No easy bruising.  Travel history: No recent long distant travel.   Allergy:  Allergies  Allergen Reactions   Other Itching    Pt reports had allergic reaction to something she was given for pain during birth labor, but pt reports unsure of name of medication.    Past Medical History:  Diagnosis Date   Anemia    Hemorrhoid    HTN (hypertension)     Past Surgical History:  Procedure Laterality Date   CESAREAN SECTION      Social History:  reports that she has been smoking cigarettes and e-cigarettes. She has never used smokeless tobacco. She reports that she does not currently use alcohol after a past usage of about 1.0 standard drink of alcohol per week. She reports that she does not currently use drugs after having used the following drugs: Marijuana.  Family History:  Family History  Problem Relation Age of Onset   Diabetes Mother    Hypertension Mother    Diabetes Mellitus II  Maternal Grandmother    Diabetes Mellitus II Maternal Grandfather      Prior to Admission medications   Medication Sig Start Date End Date Taking? Authorizing Provider  hydrochlorothiazide (HYDRODIURIL) 25 MG tablet Take 1 tablet (25 mg total) by mouth daily. 02/03/23   Triplett, Rulon Eisenmenger B, FNP  magic mouthwash w/lidocaine SOLN Take 10 mLs by mouth 4 (four) times daily as needed for mouth pain. 30ml diphenhydramine; 30ml Maalox; 20ml Lidocaine 02/03/23   Triplett,  Cari B, FNP  methocarbamol (ROBAXIN) 500 MG tablet Take 1 tablet (500 mg total) by mouth every 8 (eight) hours as needed for muscle spasms. 12/27/22   Jene Every, MD  metroNIDAZOLE (FLAGYL) 500 MG tablet Take 1 tablet (500 mg total) by mouth 2 (two) times daily. 04/14/22   Glenna Fellows, FNP  naproxen (NAPROSYN) 500 MG tablet Take 1 tablet (500 mg total) by mouth 2 (two) times daily with a meal. 02/03/23   Triplett, Cari B, FNP  ondansetron (ZOFRAN-ODT) 4 MG disintegrating tablet Take 1 tablet (4 mg total) by mouth every 8 (eight) hours as needed. Patient not taking: Reported on 04/14/2022 09/18/21   Delton Prairie, MD    Physical Exam: Vitals:   03/12/23 0900 03/12/23 0930 03/12/23 1043 03/12/23 1050  BP: (!) 157/108   (!) 151/112  Pulse:    (!) 106  Resp: (!) 24 18  18   Temp:    97.6 F (36.4 C)  TempSrc:      SpO2:  100%  99%  Weight:   66.8 kg   Height:   5' (1.524 m)    General: Not in acute distress HEENT:       Eyes: PERRL, EOMI, no jaundice       ENT: No discharge from the ears and nose, no pharynx injection, no tonsillar enlargement.        Neck: No JVD, no bruit, no mass felt. Heme: No neck lymph node enlargement. Cardiac: S1/S2, RRR, No gallops or rubs. Respiratory: No rales, wheezing, rhonchi or rubs. GI: Soft, nondistended, nontender, no rebound pain, no organomegaly, BS present. GU: No hematuria Ext: No pitting leg edema bilaterally. 1+DP/PT pulse bilaterally. Musculoskeletal: No joint deformities, No joint redness or warmth, no limitation of ROM in spin. Skin: No rashes.  Neuro: Alert, oriented X3, cranial nerves II-XII grossly intact, moves all extremities normally.  Psych: Patient is not psychotic, no suicidal or hemocidal ideation.  Labs on Admission: I have personally reviewed following labs and imaging studies  CBC: Recent Labs  Lab 03/09/23 0026 03/12/23 0720  WBC 5.7 9.1  HGB 9.6* 10.3*  HCT 30.3* 31.7*  MCV 81.5 79.1*  PLT 494* 260   Basic Metabolic  Panel: Recent Labs  Lab 03/09/23 0026 03/12/23 0720  NA 138 132*  K 3.1* 3.2*  CL 104 102  CO2 26 21*  GLUCOSE 99 102*  BUN 10 10  CREATININE 0.75 0.61  CALCIUM 8.7* 8.9  MG  --  1.9   GFR: Estimated Creatinine Clearance: 84.5 mL/min (by C-G formula based on SCr of 0.61 mg/dL). Liver Function Tests: Recent Labs  Lab 03/12/23 0720  AST 23  ALT 10  ALKPHOS 69  BILITOT 0.3  PROT 8.6*  ALBUMIN 3.3*   No results for input(s): "LIPASE", "AMYLASE" in the last 168 hours. No results for input(s): "AMMONIA" in the last 168 hours. Coagulation Profile: No results for input(s): "INR", "PROTIME" in the last 168 hours. Cardiac Enzymes: No results for input(s): "CKTOTAL", "CKMB", "CKMBINDEX", "TROPONINI" in the  last 168 hours. BNP (last 3 results) No results for input(s): "PROBNP" in the last 8760 hours. HbA1C: No results for input(s): "HGBA1C" in the last 72 hours. CBG: No results for input(s): "GLUCAP" in the last 168 hours. Lipid Profile: Recent Labs    03/12/23 0720  CHOL 127  HDL 30*  LDLCALC 77  TRIG 99  CHOLHDL 4.2   Thyroid Function Tests: No results for input(s): "TSH", "T4TOTAL", "FREET4", "T3FREE", "THYROIDAB" in the last 72 hours. Anemia Panel: No results for input(s): "VITAMINB12", "FOLATE", "FERRITIN", "TIBC", "IRON", "RETICCTPCT" in the last 72 hours. Urine analysis:    Component Value Date/Time   COLORURINE YELLOW (A) 03/09/2023 0612   APPEARANCEUR CLEAR (A) 03/09/2023 0612   APPEARANCEUR Clear 09/10/2020 1522   LABSPEC 1.011 03/09/2023 0612   LABSPEC 1.023 04/05/2014 1904   PHURINE 6.0 03/09/2023 0612   GLUCOSEU NEGATIVE 03/09/2023 0612   GLUCOSEU Negative 04/05/2014 1904   HGBUR NEGATIVE 03/09/2023 0612   BILIRUBINUR NEGATIVE 03/09/2023 0612   BILIRUBINUR Negative 09/10/2020 1522   BILIRUBINUR Negative 04/05/2014 1904   KETONESUR NEGATIVE 03/09/2023 0612   PROTEINUR NEGATIVE 03/09/2023 0612   NITRITE NEGATIVE 03/09/2023 0612   LEUKOCYTESUR  NEGATIVE 03/09/2023 0612   LEUKOCYTESUR 1+ 04/05/2014 1904   Sepsis Labs: @LABRCNTIP (procalcitonin:4,lacticidven:4) )No results found for this or any previous visit (from the past 240 hour(s)).   Radiological Exams on Admission: CT Angio Chest Pulmonary Embolism (PE) W or WO Contrast  Result Date: 03/12/2023 CLINICAL DATA:  Pulmonary embolism suspected, high probability. Headache and chest pain. EXAM: CT ANGIOGRAPHY CHEST WITH CONTRAST TECHNIQUE: Multidetector CT imaging of the chest was performed using the standard protocol during bolus administration of intravenous contrast. Multiplanar CT image reconstructions and MIPs were obtained to evaluate the vascular anatomy. RADIATION DOSE REDUCTION: This exam was performed according to the departmental dose-optimization program which includes automated exposure control, adjustment of the mA and/or kV according to patient size and/or use of iterative reconstruction technique. CONTRAST:  55mL OMNIPAQUE IOHEXOL 350 MG/ML SOLN COMPARISON:  None Available. FINDINGS: Cardiovascular: Acute-appearing pulmonary embolism, nearly occlusive, within the central segmental pulmonary artery branches to the lingula (series 7, images 169-175). Additional partially occlusive pulmonary embolism within a central segmental pulmonary artery branch to the LEFT upper lobe (series 7, images 137 through 140). Questionable additional small/focal nonocclusive pulmonary embolism within a peripheral segmental pulmonary branch to the posterior LEFT lower lobe (series 7, image 225). No pulmonary embolism is seen within the LEFT main pulmonary artery. No pulmonary emboli are identified within the main, lobar or segmental pulmonary arteries to the RIGHT lung. RV/LV Ratio = greater than 1 Small pericardial effusion. No thoracic aortic aneurysm or evidence of aortic dissection. Mediastinum/Nodes: No mass or enlarged lymph nodes are appreciated within the mediastinum, perihilar or axillary regions.  Esophagus is unremarkable. Trachea and central bronchi are unremarkable. Lungs/Pleura: RIGHT pleural effusion, small to moderate in size. RIGHT lower lobe consolidation is likely associated compressive atelectasis. RIGHT lung appears otherwise clear. Mild atelectasis and small pleural effusion at the LEFT lung base. LEFT lung is otherwise clear. No pneumothorax is seen. Upper Abdomen: Limited images of the upper abdomen are unremarkable. Musculoskeletal: No chest wall abnormality. No acute or significant osseous findings. Review of the MIP images confirms the above findings. IMPRESSION: 1. Multiple acute-appearing pulmonary emboli within the LEFT lung, with involvement of the LEFT upper lobe and lingula, and with probable involvement of the LEFT lower lobe, largest pulmonary emboli being occlusive or nearly occlusive, as detailed above. 2. No RIGHT-sided  pulmonary emboli identified. 3. RV/LV ratio = greater than 1, suggesting right heart strain. The presence of right heart strain has been associated with an increased risk of morbidity and mortality. Please refer to the "Code PE Focused" order set in EPIC. 4. RIGHT pleural effusion, small to moderate in size. Adjacent RIGHT lower lobe consolidation is likely associated compressive atelectasis, less likely pneumonia. 5. Small LEFT pleural effusion and associated mild atelectasis. 6. Small pericardial effusion. Critical Value/emergent results were called by telephone at the time of interpretation on 03/12/2023 at 10:55 am to provider Lorretta Harp , who verbally acknowledged these results. Electronically Signed   By: Bary Richard M.D.   On: 03/12/2023 10:57   DG Chest Portable 1 View  Result Date: 03/12/2023 CLINICAL DATA:  Short of breath, headache, chest pain EXAM: PORTABLE CHEST 1 VIEW COMPARISON:  Prior chest x-ray 03/09/2023 FINDINGS: Interval progression of bilateral pleural effusions which are now small to moderate in volume. There are associated bibasilar  airspace opacities which may reflect atelectasis and/or infiltrates. Cardiac and mediastinal contours are unchanged and grossly within normal limits. No pulmonary edema. No acute osseous abnormality. IMPRESSION: Small to moderate bilateral pleural effusions with associated bibasilar atelectasis versus infiltrates. Findings are progressed compared to the x-ray dated 03/09/2023. Electronically Signed   By: Malachy Moan M.D.   On: 03/12/2023 07:49      Assessment/Plan Principal Problem:   Acute pulmonary embolism (HCC) Active Problems:   Chest pain   Hypertensive urgency   Pleural effusion   Myocardial injury   Headache   Microcytic anemia   Smoker   Hypokalemia   Assessment and Plan:  Chest pain and myocardial injury: Patient chest pain is pleuritic.  Troponin is minimally elevated 18, which is likely due to demand ischemia secondary to elevated blood pressure.  Differential diagnosis include PE, pleurisy given bilateral pleural effusion.  -Placed on head of bed for observation --> changed to PCU as inpt -Pain control: As needed morphine, Percocet, Tylenol -Trend troponin -Check A1c, FLP, UDS -Follow-up CTA to rule out PE  Addendum: CTA showed acute PE, with CT evidence of right heart strain, also showed small pericardial effusion -Start IV heparin -Check hypercoagulable panel -Get 2D echo -Follow-up lower extremity venous Doppler -consulted Dr. Wyn Quaker of [facility].  CTA: 1. Multiple acute-appearing pulmonary emboli within the LEFT lung, with involvement of the LEFT upper lobe and lingula, and with probable involvement of the LEFT lower lobe, largest pulmonary emboli being occlusive or nearly occlusive, as detailed above. 2. No RIGHT-sided pulmonary emboli identified. 3. RV/LV ratio = greater than 1, suggesting right heart strain. The presence of right heart strain has been associated with an increased risk of morbidity and mortality. Please refer to the "Code PE Focused" order  set in EPIC. 4. RIGHT pleural effusion, small to moderate in size. Adjacent RIGHT lower lobe consolidation is likely associated compressive atelectasis, less likely pneumonia. 5. Small LEFT pleural effusion and associated mild atelectasis. 6. Small pericardial effusion.  Hypertensive urgency: Blood pressure 183/126 --> 146/111 after given 10 mg of IV labetalol -Start amlodipine 5 mg daily -IV hydralazine as needed  Pleural effusion: Patient has bilateral pleural effusion, small to moderate.  It already is not clear.  Patient does not have fever or leukocytosis, less likely to have empyema. -Thoracentesis is ordered -Follow-up fluid analysis -Check procalcitonin level, if it is significantly elevated, will start antibiotics empirically -check ERS and CRP -Patient received 60 mg of Lasix in ED  Headache: Patient's headache has  resolved.  Possibly due to elevated blood pressure -prn tylenol  Microcytic anemia: Hgb 10.3 (9.6 on 03/09/23), stable -f/u with CBC  Hypokalemia: Potassium 3.2 -Repleted with potassium -Check magnesium level --> 1.9  Smoker?: pt is documented for e-cigarette vaping, but she denies this. -she refused nicotine patch      DVT ppx: on IV heparin  Code Status: Full code   Family Communication: I offered to call her family, but the patient does not want me to call her family.  Disposition Plan:  Anticipate discharge back to previous environment  Consults called: None  Admission status and Level of care: Progressive:  as inpt   Dispo: The patient is from: Home              Anticipated d/c is to: Home              Anticipated d/c date is: 2 days              Patient currently is not medically stable to d/c.      Severity of Illness:  The appropriate patient status for this patient is INPATIENT. Inpatient status is judged to be reasonable and necessary in order to provide the required intensity of service to ensure the patient's safety. The  patient's presenting symptoms, physical exam findings, and initial radiographic and laboratory data in the context of their chronic comorbidities is felt to place them at high risk for further clinical deterioration. Furthermore, it is not anticipated that the patient will be medically stable for discharge from the hospital within 2 midnights of admission.   * I certify that at the point of admission it is my clinical judgment that the patient will require inpatient hospital care spanning beyond 2 midnights from the point of admission due to high intensity of service, high risk for further deterioration and high frequency of surveillance required.*      Date of Service 03/12/2023    Lorretta Harp [facility]   If 7PM-7AM, please contact night-coverage [email or web address] 03/12/2023, 11:09 AM

## 2023-03-12 NOTE — Consult Note (Signed)
ANTICOAGULATION CONSULT NOTE -   Pharmacy Consult for Heparin Indication: pulmonary embolus  Allergies  Allergen Reactions   Other Itching    Pt reports had allergic reaction to something she was given for pain during birth labor, but pt reports unsure of name of medication.    Patient Measurements: Height: 5' (152.4 cm) Weight: 66.8 kg (147 lb 4.3 oz) IBW/kg (Calculated) : 45.5 Heparin Dosing Weight: 59.9 kg  Vital Signs: Temp: 97.8 F (36.6 C) (07/12 1810) Temp Source: Oral (07/12 1810) BP: 147/103 (07/12 1810) Pulse Rate: 109 (07/12 1810)  Labs: Recent Labs    03/12/23 0720 03/12/23 0855 03/12/23 1056 03/12/23 1233  HGB 10.3*  --   --   --   HCT 31.7*  --   --   --   PLT 260  --   --   --   APTT  --   --  59*  --   LABPROT  --   --  15.6*  --   INR  --   --  1.2  --   CREATININE 0.61  --   --   --   TROPONINIHS 18* 20*  --  20*    Estimated Creatinine Clearance: 84.5 mL/min (by C-G formula based on SCr of 0.61 mg/dL).   Medical History: Past Medical History:  Diagnosis Date   Anemia    Hemorrhoid    HTN (hypertension)     Medications:  Facility-Administered Medications Prior to Admission  Medication Dose Route Frequency Provider Last Rate Last Admin   medroxyPROGESTERone (DEPO-PROVERA) injection 150 mg  150 mg Intramuscular Q90 days Glenna Fellows, FNP       Medications Prior to Admission  Medication Sig Dispense Refill Last Dose   hydrochlorothiazide (HYDRODIURIL) 25 MG tablet Take 1 tablet (25 mg total) by mouth daily. (Patient not taking: Reported on 03/12/2023) 30 tablet 0 Not Taking   magic mouthwash w/lidocaine SOLN Take 10 mLs by mouth 4 (four) times daily as needed for mouth pain. 30ml diphenhydramine; 30ml Maalox; 20ml Lidocaine (Patient not taking: Reported on 03/12/2023) 80 mL 0 Not Taking   methocarbamol (ROBAXIN) 500 MG tablet Take 1 tablet (500 mg total) by mouth every 8 (eight) hours as needed for muscle spasms. (Patient not taking: Reported on  03/12/2023) 20 tablet 1 Completed Course   metroNIDAZOLE (FLAGYL) 500 MG tablet Take 1 tablet (500 mg total) by mouth 2 (two) times daily. (Patient not taking: Reported on 03/12/2023) 14 tablet 0 Completed Course   naproxen (NAPROSYN) 500 MG tablet Take 1 tablet (500 mg total) by mouth 2 (two) times daily with a meal. (Patient not taking: Reported on 03/12/2023) 30 tablet 0 Completed Course   ondansetron (ZOFRAN-ODT) 4 MG disintegrating tablet Take 1 tablet (4 mg total) by mouth every 8 (eight) hours as needed. (Patient not taking: Reported on 04/14/2022) 20 tablet 0 Completed Course   Scheduled:   amLODipine  5 mg Oral Daily   [START ON 03/13/2023] aspirin EC  81 mg Oral Daily   Infusions:   heparin 1,050 Units/hr (03/12/23 1832)   PRN: acetaminophen, hydrALAZINE, morphine injection, nitroGLYCERIN, oxyCODONE-acetaminophen Anti-infectives (From admission, onward)    Start     Dose/Rate Route Frequency Ordered Stop   03/12/23 1220  ceFAZolin (ANCEF) IVPB 2g/100 mL premix        2 g 200 mL/hr over 30 Minutes Intravenous 30 min pre-op 03/12/23 1220 03/12/23 1652       Assessment: 37 y.o. female with medical history significant of  HTN, anemia, smoker, who presents with chest pain.   Chest CT:  Multiple acute-appearing pulmonary emboli within the LEFT lung, with involvement of the LEFT upper lobe and lingula, and with probable involvement of the LEFT lower lobe, largest pulmonary emboli being occlusive or nearly occlusive, as detailed above. RV//LV > 1 suggesting suggesting right heart strain.   9562 heparin drip at 1050 units/hr 0712 1600 Heparin drip stopped for Pulmonary thrombectomy 0712 1807 Heparin drip restarted at 1050 units/hr  Goal of Therapy:  Heparin level 0.3-0.7 units/ml Monitor platelets by anticoagulation protocol: Yes   Plan:  S/p pulmonary thrombectomy w/ tPA on 7/12 heparin infusion resumed at 1050 units/hr@1807  (goal HL 0.3-0.5 x 24 hrs then 0.3-0.7) Check  anti-Xa level in 6 hours and daily while on heparin Continue to monitor H&H and platelets   Taisei Bonnette A 03/12/2023,6:52 PM

## 2023-03-12 NOTE — Progress Notes (Signed)
Patient presented to IR for thoracentesis evaluation. Limited ultrasound exam revealed insufficient fluid to safely perform procedure. Please see full dictation under the imaging tab in Epic.  Alwyn Ren, [place] [phone] 03/12/2023, 1:58 PM

## 2023-03-12 NOTE — ED Provider Notes (Signed)
[facility] Provider Note    Event Date/Time   First MD Initiated Contact with Patient 03/12/23 [phone]     (approximate)  History   Chief Complaint: Chest Pain, Hypertension, and Headache  HPI  Andrea Huang is a 37 y.o. female with no significant past medical history who presents to the emergency department for chest pain and headache.  According to the patient for the past 5 days she has been experiencing a headache as well as central chest pain.  Describes pain as more of a pressure sensation to the chest along with some shortness of breath.  Patient noted to be quite hypertensive currently 175/128.  Patient states she has been told she has high blood pressure in the past but states she has never been on any medications but also does not have a PCP.  Patient denies any recent illnesses such as fever congestion vomiting or diarrhea.  States ongoing headache and chest pain which is the reason for her visit today.  States she came to the hospital several days ago but left due to long wait times.  Physical Exam   Triage Vital Signs: ED Triage Vitals  Encounter Vitals Group     BP 03/12/23 0654 (!) 175/128     Systolic BP Percentile --      Diastolic BP Percentile --      Pulse Rate 03/12/23 0654 (!) 105     Resp 03/12/23 0654 (!) 28     Temp 03/12/23 0654 98.8 F (37.1 C)     Temp Source 03/12/23 0654 Oral     SpO2 03/12/23 0648 100 %     Weight --      Height --      Head Circumference --      Peak Flow --      Pain Score 03/12/23 0653 10     Pain Loc --      Pain Education --      Exclude from Growth Chart --     Most recent vital signs: Vitals:   03/12/23 0648 03/12/23 0654  BP:  (!) 175/128  Pulse:  (!) 105  Resp:  (!) 28  Temp:  98.8 F (37.1 C)  SpO2: 100% 98%    General: Awake, somewhat anxious in appearance. CV:  Good peripheral perfusion.  Regular rate and rhythm around 100 bpm Resp:  Normal effort.  Equal breath sounds  bilaterally.  No wheeze rales or rhonchi Abd:  No distention.  Soft, nontender.  No rebound or guarding. Other:  No lower extremity edema   ED Results / Procedures / Treatments   EKG  EKG viewed and interpreted by myself shows sinus tachycardia at 103 bpm with a narrow QRS, normal axis, normal intervals, no concerning ST changes.  RADIOLOGY  I have reviewed interpret the chest x-ray images.  Most consistent with basilar atelectasis versus edema versus effusion.  Radiology is read the x-ray is moderate bilateral pleural effusions with atelectasis versus infiltrate.  Patient denies any fever or other infectious symptoms.   MEDICATIONS ORDERED IN ED: Medications  labetalol (NORMODYNE) injection 10 mg (has no administration in time range)  LORazepam (ATIVAN) tablet 1 mg (has no administration in time range)     IMPRESSION / MDM / ASSESSMENT AND PLAN / ED COURSE  I reviewed the triage vital signs and the nursing notes.  Patient's presentation is most consistent with acute presentation with potential threat to life or bodily function.  Patient presents to  the emergency department for chest pain and headache found to be quite hypertensive.  Patient is somewhat anxious appearing.  She is hypertensive 175/128.  Given the patient's chest pain and shortness of breath we will dose 10 mg of IV labetalol to reduce afterload.  Will obtain labs including cardiac enzymes and a chest x-ray to evaluate for any possible pulmonary edema.  We will dose Toradol for the patient's headache and continue to closely monitor while awaiting results.  Patient agreeable to plan of care.  She is quite anxious throughout exam we will dose 1 mg of oral Ativan to see if this helps with the patient's symptoms as well as blood pressure.  I do believe the patient will likely need to be started on long-term antihypertensive medication however patient will also need a PCP going forward.  Patient's labs have resulted showing  slight troponin elevation at 18, normal BNP, chemistry shows no significant findings CBC shows mild anemia but no significant findings.  Patient's chest x-ray however appears to show fluid possibly pleural effusions.  Given the patient's chest pain initial hypertension and chest x-ray findings I have dosed IV Lasix.  Patient's blood pressure appears to be responding somewhat to the labetalol currently 146/111 although diastolic remains elevated.  Patient satting 100% on room air with no respiratory distress although mild tachypnea at times.  Given the patient's hypertension chest x-ray findings we will admit to the hospital service for ongoing workup and management.  Patient agreeable to plan of care.  CRITICAL CARE Performed by: Minna Antis   Total critical care time: 30 minutes  Critical care time was exclusive of separately billable procedures and treating other patients.  Critical care was necessary to treat or prevent imminent or life-threatening deterioration.  Critical care was time spent personally by me on the following activities: development of treatment plan with patient and/or surrogate as well as nursing, discussions with consultants, evaluation of patient's response to treatment, examination of patient, obtaining history from patient or surrogate, ordering and performing treatments and interventions, ordering and review of laboratory studies, ordering and review of radiographic studies, pulse oximetry and re-evaluation of patient's condition.   FINAL CLINICAL IMPRESSION(S) / ED DIAGNOSES   Uncontrolled hypertension Hypertensive emergency Pulmonary edema   Note:  This document was prepared using Dragon voice recognition software and may include unintentional dictation errors.   Minna Antis, MD 03/12/23 [phone]

## 2023-03-12 NOTE — ED Triage Notes (Signed)
First Nurse Note: BIB [facility] from home. Pt c/o headache and chest pain, both rated 10/10. Seen on Monday for the same and reportedly left AMA d/t wait time. Hx of same but no pcp to receive medication. 324 asa given with EMS en route. 18 G LFA placed by EMS.   170/110 manual bp with EMS CBG 124 RR 18 HR 105 100 % RA

## 2023-03-12 NOTE — H&P (View-Only) (Signed)
Hospital Consult    Reason for Consult:  Pulmonary Embolism with right heart strain Requesting Physician:  Dr Lorretta Harp MD MRN #:  [id number]  History of Present Illness: This is a 37 y.o. female with medical history significant of HTN, anemia, smoker, who presents with chest pain.  Pt states that she has chest pain for more than 5 days. It is located in the front chest and right flank area, constant, sharp, 10 out of 10 in severity, nonradiating, pleuritic, aggravated by deep breath and coughing.  No fever or chills.  She has shortness of breath and dry cough.  No fall or injury.  Patient denies nausea, vomiting, diarrhea or abdominal pain.  No symptoms of UTI.   On exam this morning the patient is resting comfortably in bed. Heparin is infusing. She endorses right sided chest pain on deep inspiration. Intermittent cough today. Bilateral lower extremities are warm to touch. She denies any pain on palpation. She denies any long periods of sitting or traveling out of the country. Vascular Surgery consulted for pulmonary thrombectomy.    Past Medical History:  Diagnosis Date   Anemia    Hemorrhoid    HTN (hypertension)     Past Surgical History:  Procedure Laterality Date   CESAREAN SECTION      Allergies  Allergen Reactions   Other Itching    Pt reports had allergic reaction to something she was given for pain during birth labor, but pt reports unsure of name of medication.    Prior to Admission medications   Medication Sig Start Date End Date Taking? Authorizing Provider  hydrochlorothiazide (HYDRODIURIL) 25 MG tablet Take 1 tablet (25 mg total) by mouth daily. Patient not taking: Reported on 03/12/2023 02/03/23   Chinita Pester, FNP  magic mouthwash w/lidocaine SOLN Take 10 mLs by mouth 4 (four) times daily as needed for mouth pain. 30ml diphenhydramine; 30ml Maalox; 20ml Lidocaine Patient not taking: Reported on 03/12/2023 02/03/23   Kem Boroughs B, FNP  methocarbamol (ROBAXIN)  500 MG tablet Take 1 tablet (500 mg total) by mouth every 8 (eight) hours as needed for muscle spasms. Patient not taking: Reported on 03/12/2023 12/27/22   Jene Every, MD  metroNIDAZOLE (FLAGYL) 500 MG tablet Take 1 tablet (500 mg total) by mouth 2 (two) times daily. Patient not taking: Reported on 03/12/2023 04/14/22   Glenna Fellows, FNP  naproxen (NAPROSYN) 500 MG tablet Take 1 tablet (500 mg total) by mouth 2 (two) times daily with a meal. Patient not taking: Reported on 03/12/2023 02/03/23   Kem Boroughs B, FNP  ondansetron (ZOFRAN-ODT) 4 MG disintegrating tablet Take 1 tablet (4 mg total) by mouth every 8 (eight) hours as needed. Patient not taking: Reported on 04/14/2022 09/18/21   Delton Prairie, MD    Social History   Socioeconomic History   Marital status: Single    Spouse name: Not on file   Number of children: Not on file   Years of education: Not on file   Highest education level: Not on file  Occupational History   Not on file  Tobacco Use   Smoking status: Every Day    Current packs/day: 0.00    Types: Cigarettes, E-cigarettes    Last attempt to quit: 2021    Years since quitting: 3.5   Smokeless tobacco: Never  Vaping Use   Vaping status: Every Day   Substances: Flavoring  Substance and Sexual Activity   Alcohol use: Not Currently    Alcohol/week: 1.0 standard  drink of alcohol    Types: 1 Shots of liquor per week    Comment: socially maybe once amonth   Drug use: Not Currently    Types: Marijuana    Comment: last use age 75   Sexual activity: Yes    Partners: Male    Birth control/protection: None  Other Topics Concern   Not on file  Social History Narrative   Not on file   Social Determinants of Health   Financial Resource Strain: Not on file  Food Insecurity: No Food Insecurity (03/12/2023)   Hunger Vital Sign    Worried About Running Out of Food in the Last Year: Never true    Ran Out of Food in the Last Year: Never true  Transportation Needs: No  Transportation Needs (03/12/2023)   PRAPARE - Administrator, Civil Service (Medical): No    Lack of Transportation (Non-Medical): No  Physical Activity: Not on file  Stress: Not on file  Social Connections: Not on file  Intimate Partner Violence: Patient Declined (03/12/2023)   Humiliation, Afraid, Rape, and Kick questionnaire    Fear of Current or Ex-Partner: Patient declined    Emotionally Abused: Patient declined    Physically Abused: Patient declined    Sexually Abused: Patient declined     Family History  Problem Relation Age of Onset   Diabetes Mother    Hypertension Mother    Diabetes Mellitus II Maternal Grandmother    Diabetes Mellitus II Maternal Grandfather     ROS: Otherwise negative unless mentioned in HPI  Physical Examination  Vitals:   03/12/23 0930 03/12/23 1050  BP:  (!) 151/112  Pulse:  (!) 106  Resp: 18 18  Temp:  97.6 F (36.4 C)  SpO2: 100% 99%   Body mass index is 28.76 kg/m.  General:  WDWN in NAD Gait: Not observed HENT: WNL, normocephalic Pulmonary: normal non-labored breathing, without Rales, rhonchi,  wheezing Cardiac: regular, without  Murmurs, rubs or gallops; without carotid bruits Abdomen: Positive bowel sounds, soft, NT/ND, no masses Skin: without rashes Vascular Exam/Pulses: Palpable pulses throughout, all extremities are warm to touch with palpable pulses. Extremities: without ischemic changes, without Gangrene , without cellulitis; without open wounds;  Musculoskeletal: no muscle wasting or atrophy  Neurologic: A&O X 3;  No focal weakness or paresthesias are detected; speech is fluent/normal Psychiatric:  The pt has Normal affect. Lymph:  Unremarkable  CBC    Component Value Date/Time   WBC 9.1 03/12/2023 0720   RBC 4.01 03/12/2023 0720   HGB 10.3 (L) 03/12/2023 0720   HGB 9.3 (L) 10/27/2011 1418   HCT 31.7 (L) 03/12/2023 0720   HCT 29.0 (L) 10/27/2011 1418   PLT 260 03/12/2023 0720   PLT 249 10/27/2011 1418    MCV 79.1 (L) 03/12/2023 0720   MCV 75 (L) 10/27/2011 1418   MCH 25.7 (L) 03/12/2023 0720   MCHC 32.5 03/12/2023 0720   RDW 16.5 (H) 03/12/2023 0720   RDW 15.6 (H) 10/27/2011 1418   LYMPHSABS 1.0 05/23/2015 1210   MONOABS 1.6 (H) 05/23/2015 1210   EOSABS 0.1 05/23/2015 1210   BASOSABS 0.0 05/23/2015 1210    BMET    Component Value Date/Time   NA 132 (L) 03/12/2023 0720   NA 137 10/27/2011 1418   K 3.2 (L) 03/12/2023 0720   K 4.2 10/27/2011 1418   CL 102 03/12/2023 0720   CL 105 10/27/2011 1418   CO2 21 (L) 03/12/2023 0720   CO2 26  10/27/2011 1418   GLUCOSE 102 (H) 03/12/2023 0720   GLUCOSE 82 10/27/2011 1418   BUN 10 03/12/2023 0720   BUN 16 10/27/2011 1418   CREATININE 0.61 03/12/2023 0720   CREATININE 0.77 10/27/2011 1418   CALCIUM 8.9 03/12/2023 0720   CALCIUM 9.6 10/27/2011 1418   GFRNONAA >60 03/12/2023 0720   GFRNONAA >60 10/27/2011 1418   GFRAA >60 09/10/2019 1253   GFRAA >60 10/27/2011 1418    COAGS: No results found for: "INR", "PROTIME"   Non-Invasive Vascular Imaging:   EXAM:03/12/23 CT ANGIOGRAPHY CHEST WITH CONTRAST   TECHNIQUE: Multidetector CT imaging of the chest was performed using the standard protocol during bolus administration of intravenous contrast. Multiplanar CT image reconstructions and MIPs were obtained to evaluate the vascular anatomy.   RADIATION DOSE REDUCTION: This exam was performed according to the departmental dose-optimization program which includes automated exposure control, adjustment of the mA and/or kV according to patient size and/or use of iterative reconstruction technique.   CONTRAST:  55mL OMNIPAQUE IOHEXOL 350 MG/ML SOLN   COMPARISON:  None Available.   FINDINGS: Cardiovascular: Acute-appearing pulmonary embolism, nearly occlusive, within the central segmental pulmonary artery branches to the lingula (series 7, images 169-175).   Additional partially occlusive pulmonary embolism within a  central segmental pulmonary artery branch to the LEFT upper lobe (series 7, images 137 through 140).   Questionable additional small/focal nonocclusive pulmonary embolism within a peripheral segmental pulmonary branch to the posterior LEFT lower lobe (series 7, image 225).   No pulmonary embolism is seen within the LEFT main pulmonary artery. No pulmonary emboli are identified within the main, lobar or segmental pulmonary arteries to the RIGHT lung.   RV/LV Ratio = greater than 1   Small pericardial effusion. No thoracic aortic aneurysm or evidence of aortic dissection.   Mediastinum/Nodes: No mass or enlarged lymph nodes are appreciated within the mediastinum, perihilar or axillary regions. Esophagus is unremarkable. Trachea and central bronchi are unremarkable.   Lungs/Pleura: RIGHT pleural effusion, small to moderate in size. RIGHT lower lobe consolidation is likely associated compressive atelectasis. RIGHT lung appears otherwise clear.   Mild atelectasis and small pleural effusion at the LEFT lung base. LEFT lung is otherwise clear. No pneumothorax is seen.   Upper Abdomen: Limited images of the upper abdomen are unremarkable.   Musculoskeletal: No chest wall abnormality. No acute or significant osseous findings.   Review of the MIP images confirms the above findings.   IMPRESSION: 1. Multiple acute-appearing pulmonary emboli within the LEFT lung, with involvement of the LEFT upper lobe and lingula, and with probable involvement of the LEFT lower lobe, largest pulmonary emboli being occlusive or nearly occlusive, as detailed above. 2. No RIGHT-sided pulmonary emboli identified. 3. RV/LV ratio = greater than 1, suggesting right heart strain. The presence of right heart strain has been associated with an increased risk of morbidity and mortality. Please refer to the "Code PE Focused" order set in EPIC. 4. RIGHT pleural effusion, small to moderate in size. Adjacent  RIGHT lower lobe consolidation is likely associated compressive atelectasis, less likely pneumonia. 5. Small LEFT pleural effusion and associated mild atelectasis. 6. Small pericardial effusion.    Statin:  No. Beta Blocker:  No. Aspirin:  No. ACEI:  No. ARB:  No. CCB use:  No Other antiplatelets/anticoagulants:  No.    ASSESSMENT/PLAN: This is a 37 y.o. female who presents to [facility] ER with right sided chest pain and cough. This started more than 5 days ago and is sharp  and aggravated by deep breathing and coughing. On workup she underwent a CTA of the chest revealing pulmonary embolisms with right sided heart strain.   Vascular Surgery plans on taking the patient to the vascular lab for a pulmonary thrombectomy this afternoon 03/12/23. I discussed in detail with the patient the procedure, benefits, complications and risks. She verbalizes her understanding and wishes to proceed as soon as possible. I answered all of her questions. Patient reports she has been NPO since midnight last night.      -I discussed the plan in detail with Dr Festus Barren MD and he agrees with the plan.    Marcie Bal [facility] 03/12/2023 11:25 AM

## 2023-03-13 ENCOUNTER — Inpatient Hospital Stay (HOSPITAL_COMMUNITY)
Admit: 2023-03-13 | Discharge: 2023-03-13 | Disposition: A | Discharge status: Home / Self Care | Payer: Medicaid Other | Attending: Vascular Surgery | Admitting: Vascular Surgery

## 2023-03-13 DIAGNOSIS — I2699 Other pulmonary embolism without acute cor pulmonale: Secondary | ICD-10-CM | POA: Diagnosis not present

## 2023-03-13 DIAGNOSIS — D509 Iron deficiency anemia, unspecified: Secondary | ICD-10-CM | POA: Diagnosis not present

## 2023-03-13 DIAGNOSIS — I2609 Other pulmonary embolism with acute cor pulmonale: Secondary | ICD-10-CM | POA: Diagnosis not present

## 2023-03-13 LAB — HOMOCYSTEINE: Homocysteine: 12 umol/L (ref 0.0–14.5)

## 2023-03-13 LAB — PROTEIN S ACTIVITY: Protein S Activity: 47 % — ABNORMAL LOW (ref 63–140)

## 2023-03-13 LAB — ECHOCARDIOGRAM COMPLETE
AR max vel: 2.19 cm2
AV Peak grad: 7.4 mmHg
Ao pk vel: 1.36 m/s
Area-P 1/2: 5.62 cm2
S' Lateral: 2.4 cm

## 2023-03-13 LAB — PROTEIN S, TOTAL: Protein S Ag, Total: 88 % (ref 60–150)

## 2023-03-13 LAB — LUPUS ANTICOAGULANT PANEL
DRVVT: 43.3 s (ref 0.0–47.0)
PTT Lupus Anticoagulant: 70.2 s — ABNORMAL HIGH (ref 0.0–43.5)

## 2023-03-13 LAB — BETA-2-GLYCOPROTEIN I ABS, IGG/M/A
Beta-2 Glyco I IgG: <9 GPI IgG units (ref 0–20)
Beta-2-Glycoprotein I IgA: 11 GPI IgA units (ref 0–25)
Beta-2-Glycoprotein I IgM: <9 GPI IgM units (ref 0–32)

## 2023-03-13 LAB — BASIC METABOLIC PANEL
Anion gap: 8 (ref 5–15)
BUN: 10 mg/dL (ref 6–20)
CO2: 24 mmol/L (ref 22–32)
Calcium: 8.3 mg/dL — ABNORMAL LOW (ref 8.9–10.3)
Chloride: 103 mmol/L (ref 98–111)
Creatinine, Ser: 0.64 mg/dL (ref 0.44–1.00)
GFR, Estimated: >60 mL/min (ref >60)
Glucose, Bld: 108 mg/dL — ABNORMAL HIGH (ref 70–99)
Potassium: 3.6 mmol/L (ref 3.5–5.1)
Sodium: 135 mmol/L (ref 135–145)

## 2023-03-13 LAB — HEXAGONAL PHASE PHOSPHOLIPID: Hexagonal Phase Phospholipid: 20 s — ABNORMAL HIGH (ref 0–11)

## 2023-03-13 LAB — HEMOGLOBIN A1C
Hgb A1c MFr Bld: 5.6 % (ref 4.8–5.6)
Mean Plasma Glucose: 114 mg/dL

## 2023-03-13 LAB — HEPARIN LEVEL (UNFRACTIONATED)
Heparin Unfractionated: 0.28 IU/mL — ABNORMAL LOW (ref 0.30–0.70)
Heparin Unfractionated: 0.41 IU/mL (ref 0.30–0.70)

## 2023-03-13 LAB — MAGNESIUM: Magnesium: 1.8 mg/dL (ref 1.7–2.4)

## 2023-03-13 LAB — CBC
HCT: 27.8 % — ABNORMAL LOW (ref 36.0–46.0)
Hemoglobin: 9.1 g/dL — ABNORMAL LOW (ref 12.0–15.0)
MCH: 26 pg (ref 26.0–34.0)
MCHC: 32.7 g/dL (ref 30.0–36.0)
MCV: 79.4 fL — ABNORMAL LOW (ref 80.0–100.0)
Platelets: 264 10*3/uL (ref 150–400)
RBC: 3.5 MIL/uL — ABNORMAL LOW (ref 3.87–5.11)
RDW: 16.5 % — ABNORMAL HIGH (ref 11.5–15.5)
WBC: 6 10*3/uL (ref 4.0–10.5)
nRBC: 0 % (ref 0.0–0.2)

## 2023-03-13 LAB — PROTEIN C ACTIVITY: Protein C Activity: 108 % (ref 73–180)

## 2023-03-13 LAB — PTT-LA MIX: PTT-LA Mix: 63.6 s — ABNORMAL HIGH (ref 0.0–40.5)

## 2023-03-13 MED ORDER — APIXABAN 5 MG PO TABS
10.0000 mg | ORAL_TABLET | Freq: Two times a day (BID) | ORAL | Status: DC
  Administered 2023-03-13 – 2023-03-15 (×5): 10 mg via ORAL
  Filled 2023-03-13 (×5): qty 2

## 2023-03-13 MED ORDER — APIXABAN 5 MG PO TABS: 5.0000 mg | ORAL_TABLET | Freq: Two times a day (BID) | ORAL | Status: DC

## 2023-03-13 NOTE — Assessment & Plan Note (Addendum)
Attributed to uncontrolled BP on admission.  Also chart review, headaches mentioned at ED visits when BP also elevated.  She did have some headaches once BP was more controlled, with photophobia.    ?Migraine headaches Pt reports intermittent right-sided severe headaches with light sensitivity.  No prior history of migraines, but suspect could be.  Resolved. --Tylenol PRN --PCP follow up --Consider migraine prophylactic therapy if pt continues to have recurrent episodes --BP control as outlined

## 2023-03-13 NOTE — Assessment & Plan Note (Addendum)
Multiple left-sided Pe's noted on CTA chest on admission 7/12.  Now s/p mechanical thrombectomy.  Doppler U/S of b/l lower extremities negative for DVT's.  Pt denies personal or known family history of blood clots.  Pt never developed hypoxia, and is ambulating on room airs with O2 sats in mid 90's.  HR's remain slightly elevated due to right-sided musculoskeletal chest wall pain.   --Vascular surgery consulted --Transitioned IV heparin >> Eliquis 7/13 patients  --Hematology consulted - appreciatee recommendations.  Follow up in 1-2 weeks to be scheduled. --ELIQUIS 10 mg BID x 7 days then 5 mg BID for at least 6 months --Eliquis duration to be determined at follow up pending remainder of hypercoagulable evaluation --Pain control PRN  --Follow up remaining hypercoag labs still pending (below)  Hypercoag panel ---  Positive Lupus anticoagulant Elevated cardiolipin ab's, PTT-LA mix and hexagonal phase phospholipid Normal / negative --  AT III, Protein C and C activity, beta-2-glycoprotein ab's, homocysteine Pending -- protein C total, factor 5 leiden, prothrombin gene mutation

## 2023-03-13 NOTE — Assessment & Plan Note (Addendum)
Unclear etiology. Patient has bilateral pleural effusion, small to moderate.  Etiology is not clear, but the left side was present on CXR and CT back on 12/27/2022.   ? Secondary to acute PE's.   No fever or leukocytosis, unlikely empyema but pt was treated from the ED for pneumonia in April.  No respiratory symptoms currently.  --Thoracentesis was ordered on admission, but on U/S, not enough fluid to safely perform thora. --Procalcitonin < 0.10 twice, holding off antibiotics  --Inflammatory markers elevated, but this in non-specific.  ESR (76) and CRP (7.4) --7/15 - repeat chest x-ray showed similar size -- unable to safely perform thoracentesis --Pulmonology follow up recommended.  Ambulatory referral order placed.

## 2023-03-13 NOTE — Plan of Care (Signed)
  Problem: Education: Goal: Knowledge of General Education information will improve Description: Including pain rating scale, medication(s)/side effects and non-pharmacologic comfort measures Outcome: Progressing   Problem: Health Behavior/Discharge Planning: Goal: Ability to manage health-related needs will improve Outcome: Progressing   Problem: Education: Goal: Knowledge of General Education information will improve Description: Including pain rating scale, medication(s)/side effects and non-pharmacologic comfort measures Outcome: Progressing   Problem: Clinical Measurements: Goal: Ability to maintain clinical measurements within normal limits will improve Outcome: Progressing Goal: Will remain free from infection Outcome: Progressing Goal: Diagnostic test results will improve Outcome: Progressing Goal: Respiratory complications will improve Outcome: Progressing Goal: Cardiovascular complication will be avoided Outcome: Progressing   

## 2023-03-13 NOTE — Plan of Care (Signed)
  Problem: Clinical Measurements: Goal: Ability to maintain clinical measurements within normal limits will improve Outcome: Progressing Goal: Will remain free from infection Outcome: Progressing Goal: Cardiovascular complication will be avoided Outcome: Progressing   

## 2023-03-13 NOTE — Assessment & Plan Note (Addendum)
Demand ischemia due to acute Pe's Not consistent with ACS. EKG non-acute.

## 2023-03-13 NOTE — Assessment & Plan Note (Signed)
Potassium 3.2 on admission was replaced.  Mg level 1.9 normal. --Monitor BMP

## 2023-03-13 NOTE — Consult Note (Signed)
ANTICOAGULATION CONSULT NOTE -   Pharmacy Consult for Heparin Indication: pulmonary embolus  Allergies  Allergen Reactions   Other Itching    Pt reports had allergic reaction to something she was given for pain during birth labor, but pt reports unsure of name of medication.    Patient Measurements: Height: 5' (152.4 cm) Weight: 66.8 kg (147 lb 4.3 oz) IBW/kg (Calculated) : 45.5 Heparin Dosing Weight: 59.9 kg  Vital Signs: Temp: 98.7 F (37.1 C) (07/12 2310) Temp Source: Oral (07/12 2310) BP: 122/85 (07/12 2310) Pulse Rate: 103 (07/12 2310)  Labs: Recent Labs    03/12/23 0720 03/12/23 0855 03/12/23 1056 03/12/23 1233 03/12/23 1845 03/13/23 0000  HGB 10.3*  --   --   --   --   --   HCT 31.7*  --   --   --   --   --   PLT 260  --   --   --   --   --   APTT  --   --  59*  --   --   --   LABPROT  --   --  15.6*  --   --   --   INR  --   --  1.2  --   --   --   HEPARINUNFRC  --   --   --   --   --  0.28*  CREATININE 0.61  --   --   --   --   --   TROPONINIHS 18* 20*  --  20* 74*  --     Estimated Creatinine Clearance: 84.5 mL/min (by C-G formula based on SCr of 0.61 mg/dL).   Medical History: Past Medical History:  Diagnosis Date   Anemia    Hemorrhoid    HTN (hypertension)     Medications:  Facility-Administered Medications Prior to Admission  Medication Dose Route Frequency Provider Last Rate Last Admin   medroxyPROGESTERone (DEPO-PROVERA) injection 150 mg  150 mg Intramuscular Q90 days Glenna Fellows, FNP       Medications Prior to Admission  Medication Sig Dispense Refill Last Dose   hydrochlorothiazide (HYDRODIURIL) 25 MG tablet Take 1 tablet (25 mg total) by mouth daily. (Patient not taking: Reported on 03/12/2023) 30 tablet 0 Not Taking   magic mouthwash w/lidocaine SOLN Take 10 mLs by mouth 4 (four) times daily as needed for mouth pain. 30ml diphenhydramine; 30ml Maalox; 20ml Lidocaine (Patient not taking: Reported on 03/12/2023) 80 mL 0 Not Taking    methocarbamol (ROBAXIN) 500 MG tablet Take 1 tablet (500 mg total) by mouth every 8 (eight) hours as needed for muscle spasms. (Patient not taking: Reported on 03/12/2023) 20 tablet 1 Completed Course   metroNIDAZOLE (FLAGYL) 500 MG tablet Take 1 tablet (500 mg total) by mouth 2 (two) times daily. (Patient not taking: Reported on 03/12/2023) 14 tablet 0 Completed Course   naproxen (NAPROSYN) 500 MG tablet Take 1 tablet (500 mg total) by mouth 2 (two) times daily with a meal. (Patient not taking: Reported on 03/12/2023) 30 tablet 0 Completed Course   ondansetron (ZOFRAN-ODT) 4 MG disintegrating tablet Take 1 tablet (4 mg total) by mouth every 8 (eight) hours as needed. (Patient not taking: Reported on 04/14/2022) 20 tablet 0 Completed Course   Scheduled:   amLODipine  5 mg Oral Daily   aspirin EC  81 mg Oral Daily   Infusions:   heparin 1,050 Units/hr (03/12/23 1832)   PRN: acetaminophen, hydrALAZINE, morphine injection, nitroGLYCERIN, oxyCODONE-acetaminophen  Anti-infectives (From admission, onward)    Start     Dose/Rate Route Frequency Ordered Stop   03/12/23 1220  ceFAZolin (ANCEF) IVPB 2g/100 mL premix        2 g 200 mL/hr over 30 Minutes Intravenous 30 min pre-op 03/12/23 1220 03/12/23 1652       Assessment: 37 y.o. female with medical history significant of HTN, anemia, smoker, who presents with chest pain.   Chest CT:  Multiple acute-appearing pulmonary emboli within the LEFT lung, with involvement of the LEFT upper lobe and lingula, and with probable involvement of the LEFT lower lobe, largest pulmonary emboli being occlusive or nearly occlusive, as detailed above. RV//LV > 1 suggesting suggesting right heart strain.   1610 heparin drip at 1050 units/hr 0712 1600 Heparin drip stopped for Pulmonary thrombectomy 0712 1807 Heparin drip restarted at 1050 units/hr  Goal of Therapy:  Heparin level 0.3-0.7 units/ml Monitor platelets by anticoagulation protocol: Yes  7/13 0000 HL  0.28, subtherapeutic   Plan:  S/p pulmonary thrombectomy w/ tPA on 7/12 Increase heparin infusion to 1100 units/hr (goal HL 0.3-0.5 x 24 hrs then 0.3-0.7) Recheck HL in 6 hr after rate change Continue to monitor H&H and platelets  Otelia Sergeant, PharmD, [facility] 03/13/2023 1:29 AM

## 2023-03-13 NOTE — Hospital Course (Signed)
HPI on admission 03/12/23 by Dr. Clyde Lundborg: "Andrea Huang is a 37 y.o. female with medical history significant of HTN, anemia, smoker, who presents with chest pain   Pt states that she has chest pain for more than 5 days. It is located in the front chest and right flank area, constant, sharp, 10 out of 10 in severity, nonradiating, pleuritic, aggravated by deep breath and coughing.  No fever or chills.  She has shortness of breath and dry cough.  No fall or injury.  Patient denies nausea, vomiting, diarrhea or abdominal pain.  No symptoms of UTI.  She had right sided headache initially, which was 10 out of 10 in severity, currently resolved, no headache when I saw patient in ED. Patient was found to have elevated blood pressure 183/126 in ED.  She states that she has history of hypertension, but not taking blood pressure medications. Patient was seen in ED for the same on 7/9, but left hospital AMA.  Chest x-ray on 7/9 showed bilateral pleural effusion.   Data reviewed independently and ED Course: pt was found to have WBC 9.1, BNP 6.9, troponin 18, potassium 3.2, GFR> 60.  Temperature normal, blood pressure 193/126, which improved to 146/111 after giving 10 mg of labetalol in ED, heart rate 102, 88, RR 43, 22, oxygen saturation 98% on room air.  Chest x-ray showed bilateral small to moderate pleural effusion.  Patient is placed on telemetry bed for observation.   CXR: Small to moderate bilateral pleural effusions with associated bibasilar atelectasis versus infiltrates. Findings are progressed compared to the x-ray dated 03/09/2023."   CTA chest subsequently revealed multiple Pe's on the left with evidence of right heart strain.  Patient was admitted to medicine service. Started on IV heparin. Vascular surgery consulted. Patient underwent mechanical thrombectomy on 7/12.  Further hospital course and management as outlined below.

## 2023-03-13 NOTE — Assessment & Plan Note (Addendum)
Right-sided chest wall pain See acute pulmonary embolism and pleural effusion.  Her CP is right-sided, but Pe's were on the left.  ? If due to pleural effusion.  However, chart review shows ED visit 03/09/23 pt reported she had fallen at a party while running and landed on her right side.  Imaging negative for fractures or dislocations.   This is consistent with musculoskeletal pain due to rib cage contusion.   --Pain control with:     --Tylenol and or Tramadol PRN    --Mobic 15 mg daily - Pt noted inadequate relief with opiates, suspect inflammatory pain  Pain has been controlled on PO medications.

## 2023-03-13 NOTE — Progress Notes (Addendum)
[facility]  PROGRESS NOTE  ASSESSMENT / PLAN: Andrea Huang is a 37 y.o. female status post mechanical thrombectomy of symptomatic pulmonary emboli 03/12/23. Doing well on room air. Reviewed results from procedure. Recommend transition to DOAC for 6 months minimum. Follow up with Dr. Wyn Quaker in 4 weeks. Call for questions.  SUBJECTIVE: Doing well. Mild chest pain persists. Mild dyspnea persists. No other complaints.   OBJECTIVE: BP 120/88 (BP Location: Left Arm)   Pulse (!) 101   Temp 98.6 F (37 C) (Oral)   Resp 18   Ht 5' (1.524 m)   Wt 66.8 kg   LMP  (LMP Unknown)   SpO2 98%   BMI 28.76 kg/m   Intake/Output Summary (Last 24 hours) at 03/13/2023 0825 Last data filed at 03/12/2023 1832 Gross per 24 hour  Intake 498.3 ml  Output --  Net 498.3 ml    No acute distress Breathing room air. Unlabored breathing Regular rate and rhythm Right groin access site clean and dry without hematoma.     Latest Ref Rng & Units 03/13/2023    5:08 AM 03/12/2023    7:20 AM 03/09/2023   12:26 AM  CBC  WBC 4.0 - 10.5 K/uL 6.0  9.1  5.7   Hemoglobin 12.0 - 15.0 g/dL 9.1  16.1  9.6   Hematocrit 36.0 - 46.0 % 27.8  31.7  30.3   Platelets 150 - 400 K/uL 264  260  494         Latest Ref Rng & Units 03/13/2023    5:08 AM 03/12/2023    7:20 AM 03/09/2023   12:26 AM  CMP  Glucose 70 - 99 mg/dL 096  045  99   BUN 6 - 20 mg/dL 10  10  10    Creatinine 0.44 - 1.00 mg/dL 4.09  8.11  9.14   Sodium 135 - 145 mmol/L 135  132  138   Potassium 3.5 - 5.1 mmol/L 3.6  3.2  3.1   Chloride 98 - 111 mmol/L 103  102  104   CO2 22 - 32 mmol/L 24  21  26    Calcium 8.9 - 10.3 mg/dL 8.3  8.9  8.7   Total Protein 6.5 - 8.1 g/dL  8.6    Total Bilirubin 0.3 - 1.2 mg/dL  0.3    Alkaline Phos 38 - 126 U/L  69    AST 15 - 41 U/L  23    ALT 0 - 44 U/L  10      Estimated Creatinine Clearance: 84.5 mL/min (by C-G formula based on SCr of 0.64 mg/dL).  Rande Brunt. Lenell Antu, MD [facility] Phone Number: [phone] 03/13/2023 8:25 AM

## 2023-03-13 NOTE — Progress Notes (Signed)
  Echocardiogram 2D Echocardiogram has been performed.  Lenor Coffin 03/13/2023, 10:20 AM

## 2023-03-13 NOTE — Progress Notes (Signed)
Progress Note   [name] WJX:[id number] DOB: Jan 10, 1986 DOA: 03/12/2023     1 DOS: the patient was seen and examined on 03/13/2023   Brief hospital course: HPI on admission 03/12/23 by Dr. Clyde Lundborg: "SHAWNITA Huang is a 37 y.o. female with medical history significant of HTN, anemia, smoker, who presents with chest pain   Pt states that she has chest pain for more than 5 days. It is located in the front chest and right flank area, constant, sharp, 10 out of 10 in severity, nonradiating, pleuritic, aggravated by deep breath and coughing.  No fever or chills.  She has shortness of breath and dry cough.  No fall or injury.  Patient denies nausea, vomiting, diarrhea or abdominal pain.  No symptoms of UTI.  She had right sided headache initially, which was 10 out of 10 in severity, currently resolved, no headache when I saw patient in ED. Patient was found to have elevated blood pressure 183/126 in ED.  She states that she has history of hypertension, but not taking blood pressure medications. Patient was seen in ED for the same on 7/9, but left hospital AMA.  Chest x-ray on 7/9 showed bilateral pleural effusion.   Data reviewed independently and ED Course: pt was found to have WBC 9.1, BNP 6.9, troponin 18, potassium 3.2, GFR> 60.  Temperature normal, blood pressure 193/126, which improved to 146/111 after giving 10 mg of labetalol in ED, heart rate 102, 88, RR 43, 22, oxygen saturation 98% on room air.  Chest x-ray showed bilateral small to moderate pleural effusion.  Patient is placed on telemetry bed for observation.   CXR: Small to moderate bilateral pleural effusions with associated bibasilar atelectasis versus infiltrates. Findings are progressed compared to the x-ray dated 03/09/2023."   CTA chest subsequently revealed multiple Pe's on the left with evidence of right heart strain.  Patient was admitted to medicine service. Started on IV heparin. Vascular surgery  consulted. Patient underwent mechanical thrombectomy on 7/12.  Further hospital course and management as outlined below.  Assessment and Plan: * Acute pulmonary embolism (HCC) Multiple left-sided Pe's noted on CTA chest on admission 7/12.  Now s/p mechanical thrombectomy.  Doppler U/S of b/l lower extremities negative for DVT's.  Pt denies personal or known family history of blood clots. --Vascular surgery consulted --Transition IV heparin >> Eliquis today --Follow pending Echo --Not hypoxic, supplement O2 if sats < 90% on room air --Pain control PRN --Ambulate --Following pending hypercoag panel   Chest pain See acute pulmonary embolism Pain control PRN  Hypertensive urgency POA, now resolved. Initial BP 183/126 --> 146/111 after given 10 mg of IV labetalol --Started amlodipine 5 mg daily --IV hydralazine as needed --Monitor BP's   Pleural effusion Patient has bilateral pleural effusion, small to moderate.  Etiology is not clear, ? Secondary to acute PE's.  No fever or leukocytosis, unlikely empyema. --Thoracentesis was ordered on admission, but on U/S, not enough fluid to safely perform thora. --Procalcitonin < 0.10, will hold off any antibiotics for now --Inflammatory markers elevated, but this in non-specific.  ESR (76) and CRP (7.4) -Give 60 mg of Lasix in ED --Monitor respiratory status  Myocardial injury Demand ischemia due to acute Pe's Not consistent with ACS. EKG non-acute. Pleuritic chest pain due to PE's  Headache Attributed to uncontrolled BP on admission. Resolved. --Tylenol PRN  Microcytic anemia Hgb 10.3 on admission (9.6 on 03/09/23), stable. --Monitor CBC  Hypokalemia Potassium 3.2 on admission was replaced.  Mg  level 1.9 normal. --Monitor BMP  Smoker Pt declined nicotine patch Pt counseled regarding importance of cessation and smoking increases risk of blood clots.        Subjective: Pt seen awake sitting up in bed this AM.  She  reports left-sided chest pain worse with inspirations, coughing.  This is somewhat improved with Percocet.  She does not feel ready to get up walking.  Some right groin discomfort from procedure yesterday.  Otherwise no acute complaints.  Denies personal or family history of blood clots.  Physical Exam: Vitals:   03/12/23 2310 03/13/23 0453 03/13/23 0756 03/13/23 1156  BP: 122/85 120/88 (!) 129/97 119/87  Pulse: (!) 103 (!) 101 97 (!) 113  Resp: 19 18 17 20   Temp: 98.7 F (37.1 C) 98.6 F (37 C) 98 F (36.7 C) 98 F (36.7 C)  TempSrc: Oral Oral Oral   SpO2: 100% 98% 100% 98%  Weight:      Height:       General exam: awake, alert, no acute distress HEENT: atraumatic, clear conjunctiva, anicteric sclera, moist mucus membranes, hearing grossly normal  Respiratory system: CTAB with diminished bases, no wheezes, normal respiratory effort on room air. Cardiovascular system: normal S1/S2, RRR, no pedal edema.   Gastrointestinal system: soft, NT, ND Central nervous system: A&O x3. no gross focal neurologic deficits, normal speech Extremities: right groin site without swelling, no edema, normal tone Skin: dry, intact, normal temperature Psychiatry: normal mood, congruent affect, judgement and insight appear normal   Data Reviewed:  Notable labs -- glucose 108, Ca 8.3 otherwise normal BMP.  Hbg 9.1 from 10.3,    Family Communication: None  Disposition: Status is: Inpatient Remains inpatient appropriate because: Pain uncontrolled.   Planned Discharge Destination: Home    Time spent: 46 minutes  Author: Pennie Banter, DO 03/13/2023 2:09 PM  For on call review [email or web address].

## 2023-03-13 NOTE — Assessment & Plan Note (Addendum)
Hgb 10.3 on admission (9.6 on 03/09/23), stable. --Monitor CBC --Pending hematology follow up for PE's

## 2023-03-13 NOTE — Assessment & Plan Note (Signed)
POA, now resolved. Initial BP 183/126 --> 146/111 after given 10 mg of IV labetalol --Started amlodipine 5 mg daily --IV hydralazine as needed --Monitor BP's

## 2023-03-13 NOTE — Assessment & Plan Note (Addendum)
Pt declined nicotine patch Pt counseled regarding importance of cessation and smoking increases risk of blood clots.

## 2023-03-14 DIAGNOSIS — E871 Hypo-osmolality and hyponatremia: Secondary | ICD-10-CM | POA: Diagnosis not present

## 2023-03-14 DIAGNOSIS — I2699 Other pulmonary embolism without acute cor pulmonale: Secondary | ICD-10-CM | POA: Diagnosis not present

## 2023-03-14 DIAGNOSIS — J90 Pleural effusion, not elsewhere classified: Secondary | ICD-10-CM | POA: Diagnosis not present

## 2023-03-14 LAB — BASIC METABOLIC PANEL
Anion gap: 6 (ref 5–15)
BUN: 11 mg/dL (ref 6–20)
CO2: 24 mmol/L (ref 22–32)
Calcium: 8.8 mg/dL — ABNORMAL LOW (ref 8.9–10.3)
Chloride: 101 mmol/L (ref 98–111)
Creatinine, Ser: 0.69 mg/dL (ref 0.44–1.00)
GFR, Estimated: >60 mL/min (ref >60)
Glucose, Bld: 93 mg/dL (ref 70–99)
Potassium: 4.2 mmol/L (ref 3.5–5.1)
Sodium: 131 mmol/L — ABNORMAL LOW (ref 135–145)

## 2023-03-14 MED ORDER — BUTALBITAL-APAP-CAFFEINE 50-325-40 MG PO TABS
1.0000 | ORAL_TABLET | Freq: Four times a day (QID) | ORAL | Status: DC | PRN
  Administered 2023-03-14 – 2023-03-15 (×2): 1 via ORAL
  Filled 2023-03-14 (×3): qty 1

## 2023-03-14 MED ORDER — ONDANSETRON HCL 4 MG/2ML IJ SOLN: 4.0000 mg | INTRAMUSCULAR | Status: DC | PRN

## 2023-03-14 NOTE — Plan of Care (Signed)

## 2023-03-14 NOTE — Assessment & Plan Note (Addendum)
Na on admission 132 >> 135 >> 131 >> 134  Improved with increased PO intake and hydration -- Monitor BMP at follow up

## 2023-03-14 NOTE — Progress Notes (Addendum)
Progress Note   [name]:[id number] DOB: 10-03-1985 DOA: 03/12/2023     2 DOS: the patient was seen and examined on 03/14/2023   Brief hospital course: HPI on admission 03/12/23 by Dr. Clyde Lundborg: "Andrea Huang is a 37 y.o. female with medical history significant of HTN, anemia, smoker, who presents with chest pain   Pt states that she has chest pain for more than 5 days. It is located in the front chest and right flank area, constant, sharp, 10 out of 10 in severity, nonradiating, pleuritic, aggravated by deep breath and coughing.  No fever or chills.  She has shortness of breath and dry cough.  No fall or injury.  Patient denies nausea, vomiting, diarrhea or abdominal pain.  No symptoms of UTI.  She had right sided headache initially, which was 10 out of 10 in severity, currently resolved, no headache when I saw patient in ED. Patient was found to have elevated blood pressure 183/126 in ED.  She states that she has history of hypertension, but not taking blood pressure medications. Patient was seen in ED for the same on 7/9, but left hospital AMA.  Chest x-ray on 7/9 showed bilateral pleural effusion.   Data reviewed independently and ED Course: pt was found to have WBC 9.1, BNP 6.9, troponin 18, potassium 3.2, GFR> 60.  Temperature normal, blood pressure 193/126, which improved to 146/111 after giving 10 mg of labetalol in ED, heart rate 102, 88, RR 43, 22, oxygen saturation 98% on room air.  Chest x-ray showed bilateral small to moderate pleural effusion.  Patient is placed on telemetry bed for observation.   CXR: Small to moderate bilateral pleural effusions with associated bibasilar atelectasis versus infiltrates. Findings are progressed compared to the x-ray dated 03/09/2023."   CTA chest subsequently revealed multiple Pe's on the left with evidence of right heart strain.  Patient was admitted to medicine service. Started on IV heparin. Vascular surgery  consulted. Patient underwent mechanical thrombectomy on 7/12.  Further hospital course and management as outlined below.  Assessment and Plan: * Acute pulmonary embolism (HCC) Multiple left-sided Pe's noted on CTA chest on admission 7/12.  Now s/p mechanical thrombectomy.  Doppler U/S of b/l lower extremities negative for DVT's.  Pt denies personal or known family history of blood clots. --Vascular surgery consulted --Transitioned IV heparin >> Eliquis 7/13 --Warfarin is 1st line in antiphospholipid patients --- will get hematology's input. --Concern for compliance with warfarin and high-maintenance monitoring -- will discuss in detail with patient --Not hypoxic, supplement O2 if sats < 90% on room air --Pain control PRN --Ambulate & check O2 sats  Hypercoag panel ---  Positive Lupus anticoagulant Elevated PTT-LA mix and hexagonal phase phospholipid Normal / negative AT III, Protein C and C activity, beta-2-glycoprotein ab's, homocysteine Pending -- protein C total, factor 5 leiden, prothrombin gene mutation, cardiolipin ab's   Chest pain See acute pulmonary embolism Pain control PRN  Hypertensive urgency POA, now resolved. Initial BP 183/126 --> 146/111 after given 10 mg of IV labetalol --Started amlodipine 5 mg daily --IV hydralazine as needed --Monitor BP's   Pleural effusion Patient has bilateral pleural effusion, small to moderate.  Etiology is not clear, ? Secondary to acute PE's.  No fever or leukocytosis, unlikely empyema. --Thoracentesis was ordered on admission, but on U/S, not enough fluid to safely perform thora. --Procalcitonin < 0.10, will hold off any antibiotics for now --Inflammatory markers elevated, but this in non-specific.  ESR (76) and CRP (7.4) -  Give 60 mg of Lasix in ED --Monitor respiratory status  Myocardial injury Demand ischemia due to acute Pe's Not consistent with ACS. EKG non-acute. Pleuritic chest pain due to PE's  Headache Attributed  to uncontrolled BP on admission. Pt reports intermittent right-sided severe headaches with light sensitivity.  No prior history of migraines, but suspect could be. Resolved. --Fioricet PRN --PCP follow up --Consider migraine prophylactic therapy if pt continues to have recurrent episodes --BP control as outlined  Microcytic anemia Hgb 10.3 on admission (9.6 on 03/09/23), stable. --Monitor CBC  Hypokalemia Potassium 3.2 on admission was replaced.  Mg level 1.9 normal. --Monitor BMP  Smoker Pt declined nicotine patch Pt counseled regarding importance of cessation and smoking increases risk of blood clots.  Hyponatremia Na on admission 132 >> 135 >> 131 Encourage PO hydration Monitor BMP        Subjective: Pt seen awake sitting up in bed this AM.  She reports ongoing right-sided chest pain worse with cough or deep breath.  She states she does not want to get up out of bed or walk around due to pain.  Pain radiates to her back.  She does state pain is adequately controlled with Percocet, but it returns when the medication wears off.  No other acute complaints.     Physical Exam: Vitals:   03/13/23 2337 03/14/23 0407 03/14/23 0952 03/14/23 1241  BP: (!) 127/92 (!) 133/92 (!) 125/98 (!) 134/96  Pulse: 97 97 96 (!) 102  Resp: 19 20 20 20   Temp: 98.1 F (36.7 C) 97.9 F (36.6 C) 98.2 F (36.8 C) 98.1 F (36.7 C)  TempSrc:      SpO2: 97% 99% 98% 98%  Weight:      Height:       General exam: awake, alert, no acute distress HEENT: moist mucus membranes, hearing grossly normal  Respiratory system: CTAB with diminished bases, no wheezes, normal respiratory effort on room air. Cardiovascular system: normal S1/S2, RRR, no pedal edema.   Gastrointestinal system: soft, NT, ND Central nervous system: A&O x3. no gross focal neurologic deficits, normal speech Extremities: right groin site without swelling, no edema, normal tone Skin: dry, intact, normal temperature Psychiatry:  normal mood, congruent affect, judgement and insight appear normal   Data Reviewed:  Notable labs -- Na 131, Ca 8.8 otherwise normal BMP.   Family Communication: None  Disposition: Status is: Inpatient Remains inpatient appropriate because: Pain uncontrolled.   Planned Discharge Destination: Home    Time spent: 42 minutes  Author: Pennie Banter, DO 03/14/2023 3:09 PM  For on call review [email or web address].

## 2023-03-15 ENCOUNTER — Encounter: Payer: Self-pay | Admitting: Vascular Surgery

## 2023-03-15 ENCOUNTER — Inpatient Hospital Stay: Payer: Medicaid Other

## 2023-03-15 DIAGNOSIS — I2699 Other pulmonary embolism without acute cor pulmonale: Secondary | ICD-10-CM | POA: Diagnosis not present

## 2023-03-15 DIAGNOSIS — I2609 Other pulmonary embolism with acute cor pulmonale: Secondary | ICD-10-CM | POA: Diagnosis not present

## 2023-03-15 DIAGNOSIS — J90 Pleural effusion, not elsewhere classified: Secondary | ICD-10-CM | POA: Diagnosis not present

## 2023-03-15 DIAGNOSIS — R0789 Other chest pain: Secondary | ICD-10-CM

## 2023-03-15 LAB — BASIC METABOLIC PANEL
Anion gap: 9 (ref 5–15)
BUN: 9 mg/dL (ref 6–20)
CO2: 25 mmol/L (ref 22–32)
Calcium: 9.3 mg/dL (ref 8.9–10.3)
Chloride: 100 mmol/L (ref 98–111)
Creatinine, Ser: 0.67 mg/dL (ref 0.44–1.00)
GFR, Estimated: >60 mL/min (ref >60)
Glucose, Bld: 94 mg/dL (ref 70–99)
Potassium: 4.4 mmol/L (ref 3.5–5.1)
Sodium: 134 mmol/L — ABNORMAL LOW (ref 135–145)

## 2023-03-15 LAB — CBC
HCT: 30.9 % — ABNORMAL LOW (ref 36.0–46.0)
Hemoglobin: 9.9 g/dL — ABNORMAL LOW (ref 12.0–15.0)
MCH: 25.6 pg — ABNORMAL LOW (ref 26.0–34.0)
MCHC: 32 g/dL (ref 30.0–36.0)
MCV: 79.8 fL — ABNORMAL LOW (ref 80.0–100.0)
Platelets: 302 10*3/uL (ref 150–400)
RBC: 3.87 MIL/uL (ref 3.87–5.11)
RDW: 16.3 % — ABNORMAL HIGH (ref 11.5–15.5)
WBC: 4.2 10*3/uL (ref 4.0–10.5)
nRBC: 0 % (ref 0.0–0.2)

## 2023-03-15 LAB — CARDIOLIPIN ANTIBODIES, IGG, IGM, IGA
Anticardiolipin IgA: <9 APL U/mL (ref 0–11)
Anticardiolipin IgG: 33 GPL U/mL — ABNORMAL HIGH (ref 0–14)
Anticardiolipin IgM: <9 MPL U/mL (ref 0–12)

## 2023-03-15 LAB — PROCALCITONIN: Procalcitonin: <0.1 ng/mL

## 2023-03-15 MED ORDER — MELOXICAM 15 MG PO TABS: 15.0000 mg | ORAL_TABLET | Freq: Every day | ORAL | 1 refills | Status: DC

## 2023-03-15 MED ORDER — ACETAMINOPHEN 500 MG PO TABS: 1000.0000 mg | ORAL_TABLET | Freq: Three times a day (TID) | ORAL | Status: AC

## 2023-03-15 MED ORDER — TRAMADOL HCL 50 MG PO TABS: 50.0000 mg | ORAL_TABLET | Freq: Four times a day (QID) | ORAL | 0 refills | Status: AC | PRN

## 2023-03-15 MED ORDER — MELOXICAM 7.5 MG PO TABS
15.0000 mg | ORAL_TABLET | Freq: Every day | ORAL | Status: DC
  Administered 2023-03-15: 15 mg via ORAL
  Filled 2023-03-15: qty 2

## 2023-03-15 MED ORDER — AMLODIPINE BESYLATE 5 MG PO TABS: 5.0000 mg | ORAL_TABLET | Freq: Every day | ORAL | 1 refills | Status: DC

## 2023-03-15 MED ORDER — APIXABAN (ELIQUIS) VTE STARTER PACK (10MG AND 5MG): ORAL_TABLET | ORAL | 0 refills | Status: DC

## 2023-03-15 MED ORDER — SACCHAROMYCES BOULARDII 250 MG PO CAPS: 250.0000 mg | ORAL_CAPSULE | Freq: Two times a day (BID) | ORAL | Status: DC

## 2023-03-15 MED ORDER — SACCHAROMYCES BOULARDII 250 MG PO CAPS
250.0000 mg | ORAL_CAPSULE | Freq: Two times a day (BID) | ORAL | Status: DC
  Administered 2023-03-15: 250 mg via ORAL
  Filled 2023-03-15 (×2): qty 1

## 2023-03-15 NOTE — Progress Notes (Addendum)
Progress Note   [name]:[id number] DOB: Jan 21, 1986 DOA: 03/12/2023     3 DOS: the patient was seen and examined on 03/15/2023   Brief hospital course: HPI on admission 03/12/23 by Dr. Clyde Lundborg: "Andrea Huang is a 37 y.o. female with medical history significant of HTN, anemia, smoker, who presents with chest pain   Pt states that she has chest pain for more than 5 days. It is located in the front chest and right flank area, constant, sharp, 10 out of 10 in severity, nonradiating, pleuritic, aggravated by deep breath and coughing.  No fever or chills.  She has shortness of breath and dry cough.  No fall or injury.  Patient denies nausea, vomiting, diarrhea or abdominal pain.  No symptoms of UTI.  She had right sided headache initially, which was 10 out of 10 in severity, currently resolved, no headache when I saw patient in ED. Patient was found to have elevated blood pressure 183/126 in ED.  She states that she has history of hypertension, but not taking blood pressure medications. Patient was seen in ED for the same on 7/9, but left hospital AMA.  Chest x-ray on 7/9 showed bilateral pleural effusion.   Data reviewed independently and ED Course: pt was found to have WBC 9.1, BNP 6.9, troponin 18, potassium 3.2, GFR> 60.  Temperature normal, blood pressure 193/126, which improved to 146/111 after giving 10 mg of labetalol in ED, heart rate 102, 88, RR 43, 22, oxygen saturation 98% on room air.  Chest x-ray showed bilateral small to moderate pleural effusion.  Patient is placed on telemetry bed for observation.   CXR: Small to moderate bilateral pleural effusions with associated bibasilar atelectasis versus infiltrates. Findings are progressed compared to the x-ray dated 03/09/2023."   CTA chest subsequently revealed multiple Pe's on the left with evidence of right heart strain.  Patient was admitted to medicine service. Started on IV heparin. Vascular surgery  consulted. Patient underwent mechanical thrombectomy on 7/12.  Further hospital course and management as outlined below.  Assessment and Plan: * Acute pulmonary embolism (HCC) Multiple left-sided Pe's noted on CTA chest on admission 7/12.  Now s/p mechanical thrombectomy.  Doppler U/S of b/l lower extremities negative for DVT's.  Pt denies personal or known family history of blood clots. --Vascular surgery consulted --Transitioned IV heparin >> Eliquis 7/13 --Warfarin is 1st line in antiphospholipid patients  --Hematology's consulted - Dr. Orlie Dakin indicated would to see pt today --Concern for compliance with warfarin and high-maintenance monitoring -- will discuss in detail with patient --Not hypoxic, supplement O2 if sats < 90% on room air --Pain control PRN --Ambulate & check O2 sats  Hypercoag panel ---  Positive Lupus anticoagulant Elevated PTT-LA mix and hexagonal phase phospholipid Normal / negative AT III, Protein C and C activity, beta-2-glycoprotein ab's, homocysteine Pending -- protein C total, factor 5 leiden, prothrombin gene mutation, cardiolipin ab's   Pleural effusion Unclear etiology. Patient has bilateral pleural effusion, small to moderate.  Etiology is not clear, ? Secondary to acute PE's.  No fever or leukocytosis, unlikely empyema -- but pt reports being treated in ED for pneumonia recently.  No respiratory symptoms currently.  --Thoracentesis was ordered on admission, but on U/S, not enough fluid to safely perform thora. --Procalcitonin < 0.10, holding off antibiotics  --Inflammatory markers elevated, but this in non-specific.  ESR (76) and CRP (7.4) --7/15 - repeat chest x-ray and will see if thoracentesis can be re-attempted for fluid studies --Given  60 mg of Lasix in ED --Monitor respiratory status  Chest pain See acute pulmonary embolism and pleural effusion.  Her CP is right-sided, but Pe's were on the left.  ? If due to pleural effusion  --Pain control  PRN --Add Mobic 15 mg daily as pt reports inadequate relief with opidates, suspect inflammatory pain  Hypertensive urgency POA, now resolved. Initial BP 183/126 --> 146/111 after given 10 mg of IV labetalol --Started amlodipine 5 mg daily --IV hydralazine as needed --Monitor BP's   Myocardial injury Demand ischemia due to acute Pe's Not consistent with ACS. EKG non-acute. Pleuritic chest pain due to PE's  Headache Attributed to uncontrolled BP on admission. Pt reports intermittent right-sided severe headaches with light sensitivity.  No prior history of migraines, but suspect could be. Resolved. --Fioricet PRN --PCP follow up --Consider migraine prophylactic therapy if pt continues to have recurrent episodes --BP control as outlined  Microcytic anemia Hgb 10.3 on admission (9.6 on 03/09/23), stable. --Monitor CBC  Hypokalemia Potassium 3.2 on admission was replaced.  Mg level 1.9 normal. --Monitor BMP  Smoker Pt declined nicotine patch Pt counseled regarding importance of cessation and smoking increases risk of blood clots.  Hyponatremia Na on admission 132 >> 135 >> 131 >> 134 Encourage PO hydration Monitor BMP        Subjective: Pt seen awake sitting up in bed this AM.  She still has right-sided chest wall pain, was not able to sleep well due to pain.  States the pain meds help but not very well and don't last long enough.  Denies shortness of breath at rest.  Ambulated some yesterday and did get quite short of breath quickly.  Also reports getting hot & cold spells overnight and this AM.  No cough.  Does say she was seen in ER not long ago and given treatment for pneumonia.     Physical Exam: Vitals:   03/14/23 2317 03/15/23 0400 03/15/23 0816 03/15/23 1152  BP: (!) 148/102 (!) 125/91 124/88 (!) 133/96  Pulse: (!) 104 90 (!) 113 (!) 112  Resp:   18 18  Temp: 98.6 F (37 C) 98.3 F (36.8 C) 98.7 F (37.1 C) 98.8 F (37.1 C)  TempSrc: Oral Oral    SpO2:  99% 100% 100% 99%  Weight:      Height:       General exam: awake, alert, no acute distress HEENT: moist mucus membranes, hearing grossly normal  Respiratory system: CTAB with diminished bases R>L, no wheezes, normal respiratory effort on room air. Cardiovascular system: normal S1/S2, RRR, no pedal edema.   Gastrointestinal system: soft, NT, ND Central nervous system: A&O x3. no gross focal neurologic deficits, normal speech Extremities: right groin site without swelling, no edema, normal tone Skin: dry, intact, normal temperature Psychiatry: normal mood, congruent affect, judgement and insight appear normal   Data Reviewed:  Notable labs -- Na 134, otherwise normal BMP.   Hbg improved 9.1 > >9.9  Family Communication: Patient's aunt updated during rounds by video call on patient's cell  Disposition: Status is: Inpatient Remains inpatient appropriate because: Pain uncontrolled.   Planned Discharge Destination: Home    Time spent: 42 minutes  Author: Pennie Banter, DO 03/15/2023 2:35 PM  For on call review [email or web address].

## 2023-03-15 NOTE — Progress Notes (Addendum)
Transition of Care [facility]) - Inpatient Brief Assessment   Patient Details  Name: Andrea Huang MRN: [id number] Date of Birth: 1986/01/09  Transition of [facility] CM/SW Contact:    Truddie Hidden, RN Phone Number: 03/15/2023, 4:17 PM   Clinical Narrative: Spoke with patient at bedside. She is requesting a new PCP. Patient is on Medicaid. She has been advised MD are assigned and to follow up with DSS. TOC assessing for ongoing needs and discharge planning.   Transition of Care Asessment: Insurance and Status: Insurance coverage has been reviewed Patient has primary care physician: Yes Home environment has been reviewed: Return to home Prior level of function:: Independent Prior/Current Home Services: No current home services Social Determinants of Health Reivew: SDOH reviewed no interventions necessary Readmission risk has been reviewed: Yes Transition of care needs: no transition of care needs at this time

## 2023-03-15 NOTE — Progress Notes (Signed)
Chap visited Pt and family at bedside. Pt expressed hope in her recovery journey and requested prayer at this moment. Chap joined [employer] and family in prayer.   03/15/23 1500  Spiritual Encounters  Type of Visit Initial  Care provided to: Pt and family  Referral source Chaplain assessment  Reason for visit Routine spiritual support  OnCall Visit No  Spiritual Framework  Presenting Themes Significant life change  Community/Connection Family  Interventions  Spiritual Care Interventions Made Established relationship of care and support;Narrative/life review;Mindfulness intervention  Intervention Outcomes  Outcomes Awareness of support;Connection to spiritual care;Reduced anxiety  Spiritual Care Plan  Spiritual Care Issues Still Outstanding No further spiritual care needs at this time (see row info)

## 2023-03-15 NOTE — Discharge Summary (Addendum)
Physician Discharge Summary   [name]: [id number] DOB: 1986/04/05  Admit date:     03/12/2023  Discharge date: 03/15/2023  Discharge Physician: Andrea Huang   PCP: [facility], [facility]   Recommendations at discharge:    Follow up with [facility] for pleural effusion evaluation - ambulatory referral order placed Follow up with Hematology  in 1-2 weeks Follow up with Primary Care in 1-2 weeks Monitor BP's - pt started on amlodipine for hypertension Repeat CBC, BMP at follow up  Discharge Diagnoses: Principal Problem:   Acute pulmonary embolism (HCC) Active Problems:   Atypical chest pain   Pleural effusion   Headache   Microcytic anemia   Smoker   Hyponatremia  Resolved Problems:   Hypertensive urgency   Myocardial injury   Hypokalemia  Hospital Course: HPI on admission 03/12/23 by Dr. Clyde Huang: "Andrea Huang is a 37 y.o. female with medical history significant of HTN, anemia, smoker, who presents with chest pain   Pt states that she has chest pain for more than 5 days. It is located in the front chest and right flank area, constant, sharp, 10 out of 10 in severity, nonradiating, pleuritic, aggravated by deep breath and coughing.  No fever or chills.  She has shortness of breath and dry cough.  No fall or injury.  Patient denies nausea, vomiting, diarrhea or abdominal pain.  No symptoms of UTI.  She had right sided headache initially, which was 10 out of 10 in severity, currently resolved, no headache when I saw patient in ED. Patient was found to have elevated blood pressure 183/126 in ED.  She states that she has history of hypertension, but not taking blood pressure medications. Patient was seen in ED for the same on 7/9, but left hospital AMA.  Chest x-ray on 7/9 showed bilateral pleural effusion.   Data reviewed independently and ED Course: pt was found to have WBC 9.1, BNP 6.9, troponin 18, potassium 3.2, GFR> 60.  Temperature  normal, blood pressure 193/126, which improved to 146/111 after giving 10 mg of labetalol in ED, heart rate 102, 88, RR 43, 22, oxygen saturation 98% on room air.  Chest x-ray showed bilateral small to moderate pleural effusion.  Patient is placed on telemetry bed for observation.   CXR: Small to moderate bilateral pleural effusions with associated bibasilar atelectasis versus infiltrates. Findings are progressed compared to the x-ray dated 03/09/2023."   CTA chest subsequently revealed multiple Pe's on the left with evidence of right heart strain.  Patient was admitted to medicine service. Started on IV heparin. Vascular surgery consulted. Patient underwent mechanical thrombectomy on 7/12.  Further hospital course and management as outlined below.   03/15/23 -- pt still having right-sided rib cage pain, controlled on oral medications.  Chest xray showed unchanged size of pleural effusion that was too small for thoracentesis.  Ambulating on room air.    Patient is medically stable for discharge today.  She was advised to follow up with Primary Care, Hematology and Pulmonology for ongoing care.   Assessment and Plan: * Acute pulmonary embolism (HCC) Multiple left-sided Pe's noted on CTA chest on admission 7/12.  Now s/p mechanical thrombectomy.  Doppler U/S of b/l lower extremities negative for DVT's.  Pt denies personal or known family history of blood clots.  Pt never developed hypoxia, and is ambulating on room airs with O2 sats in mid 90's.  HR's remain slightly elevated due to right-sided musculoskeletal chest wall pain.   --  Vascular surgery consulted --Transitioned IV heparin >> Eliquis 7/13 patients  --Hematology consulted - appreciatee recommendations.  Follow up in 1-2 weeks to be scheduled. --ELIQUIS 10 mg BID x 7 days then 5 mg BID for at least 6 months --Eliquis duration to be determined at follow up pending remainder of hypercoagulable evaluation --Pain control PRN   --Follow up remaining hypercoag labs still pending (below)  Hypercoag panel ---  Positive Lupus anticoagulant Elevated cardiolipin ab's, PTT-LA mix and hexagonal phase phospholipid Normal / negative --  AT III, Protein C and C activity, beta-2-glycoprotein ab's, homocysteine Pending -- protein C total, factor 5 leiden, prothrombin gene mutation   Pleural effusion Unclear etiology. Patient has bilateral pleural effusion, small to moderate.  Etiology is not clear, but the left side was present on CXR and CT back on 12/27/2022.   ? Secondary to acute PE's.   No fever or leukocytosis, unlikely empyema but pt was treated from the ED for pneumonia in April.  No respiratory symptoms currently.  --Thoracentesis was ordered on admission, but on U/S, not enough fluid to safely perform thora. --Procalcitonin < 0.10 twice, holding off antibiotics  --Inflammatory markers elevated, but this in non-specific.  ESR (76) and CRP (7.4) --7/15 - repeat chest x-ray showed similar size -- unable to safely perform thoracentesis --Pulmonology follow up recommended.  Ambulatory referral order placed.   Atypical chest pain Right-sided chest wall pain See acute pulmonary embolism and pleural effusion.  Her CP is right-sided, but Pe's were on the left.  ? If due to pleural effusion.  However, chart review shows ED visit 03/09/23 pt reported she had fallen at a party while running and landed on her right side.  Imaging negative for fractures or dislocations.   This is consistent with musculoskeletal pain due to rib cage contusion.   --Pain control with:     --Tylenol and or Tramadol PRN    --Mobic 15 mg daily - Pt noted inadequate relief with opiates, suspect inflammatory pain  Pain has been controlled on PO medications.  Hypertensive urgency-resolved as of 03/15/2023 POA, now resolved. Initial BP 183/126 --> 146/111 after given 10 mg of IV labetalol --Started amlodipine 5 mg daily --IV hydralazine as  needed --Monitor BP's   Myocardial injury-resolved as of 03/15/2023 Demand ischemia due to acute Pe's Not consistent with ACS. EKG non-acute.  Headache Attributed to uncontrolled BP on admission.  Also chart review, headaches mentioned at ED visits when BP also elevated.  She did have some headaches once BP was more controlled, with photophobia.    ?Migraine headaches Pt reports intermittent right-sided severe headaches with light sensitivity.  No prior history of migraines, but suspect could be.  Resolved. --Tylenol PRN --PCP follow up --Consider migraine prophylactic therapy if pt continues to have recurrent episodes --BP control as outlined  Microcytic anemia Hgb 10.3 on admission (9.6 on 03/09/23), stable. --Monitor CBC --Pending hematology follow up for PE's  Smoker Pt declined nicotine patch Pt counseled regarding importance of cessation and smoking increases risk of blood clots.  Hypokalemia-resolved as of 03/15/2023 Potassium 3.2 on admission was replaced.  Mg level 1.9 normal. --Monitor BMP  Hyponatremia Na on admission 132 >> 135 >> 131 >> 134  Improved with increased PO intake and hydration -- Monitor BMP at follow up         Consultants: Vascular Surgery Procedures performed: Thrombectomy to left pulmonary arteries for acute Pe's - see Op Note Disposition: Home Diet recommendation:  Discharge Diet Orders (From admission, onward)  Start     Ordered   03/15/23 0000  Diet - low sodium heart healthy        03/15/23 1611           Regular diet DISCHARGE MEDICATION: Allergies as of 03/15/2023       Reactions   Other Itching   Pt reports had allergic reaction to something she was given for pain during birth labor, but pt reports unsure of name of medication.        Medication List     STOP taking these medications    hydrochlorothiazide 25 MG tablet Commonly known as: HYDRODIURIL   magic mouthwash w/lidocaine Soln   methocarbamol 500 MG  tablet Commonly known as: ROBAXIN   metroNIDAZOLE 500 MG tablet Commonly known as: FLAGYL   naproxen 500 MG tablet Commonly known as: Naprosyn   ondansetron 4 MG disintegrating tablet Commonly known as: ZOFRAN-ODT       TAKE these medications    acetaminophen 500 MG tablet Commonly known as: TYLENOL Take 2 tablets (1,000 mg total) by mouth every 8 (eight) hours.   amLODipine 5 MG tablet Commonly known as: NORVASC Take 1 tablet (5 mg total) by mouth daily. Start taking on: March 16, 2023   Apixaban Starter Pack (10mg  and 5mg ) Commonly known as: ELIQUIS STARTER PACK Take as directed on package: start with two-5mg  tablets twice daily for 7 days. On day 8, switch to one-5mg  tablet twice daily.   meloxicam 15 MG tablet Commonly known as: MOBIC Take 1 tablet (15 mg total) by mouth daily. Start taking on: March 16, 2023   saccharomyces boulardii 250 MG capsule Commonly known as: FLORASTOR Take 1 capsule (250 mg total) by mouth 2 (two) times daily.   traMADol 50 MG tablet Commonly known as: Ultram Take 1-2 tablets (50-100 mg total) by mouth every 6 (six) hours as needed for moderate pain or severe pain.        Discharge Exam: Filed Weights   03/12/23 1043  Weight: 66.8 kg   General exam: awake, alert, no acute distress HEENT: moist mucus membranes, hearing grossly normal  Respiratory system: CTAB, no wheezes, rales or rhonchi, normal respiratory effort. On room air. Cardiovascular system: normal S1/S2, RRR, no JVD, murmurs, rubs, gallops, no pedal edema.   Gastrointestinal system: soft, NT, ND, no HSM felt, +bowel sounds. Central nervous system: A&O x 4. no gross focal neurologic deficits, normal speech Extremities: moves all , no edema, normal tone Skin: dry, intact, normal temperature, normal color, No rashes, lesions or ulcers Psychiatry: normal mood, congruent affect, judgement and insight appear normal   Condition at discharge: stable  The results of  significant diagnostics from this hospitalization (including imaging, microbiology, ancillary and laboratory) are listed below for reference.   Imaging Studies: DG Chest Port 1 View  Result Date: 03/15/2023 CLINICAL DATA:  Pleural effusion, right. EXAM: PORTABLE CHEST 1 VIEW COMPARISON:  03/12/2023 FINDINGS: Persistent densities at the right lung base compatible with right pleural fluid and compressive atelectasis. Improved aeration at the left lung base compared to the previous examination. Upper lungs are clear without overt pulmonary edema. Heart size is stable. Trachea is midline. Negative for a pneumothorax. IMPRESSION: 1. Persistent right pleural fluid and compressive atelectasis. Right pleural effusion appears to be similar to the recent comparison examination. 2. Improved aeration at the left lung base. Electronically Signed   By: Richarda Overlie M.D.   On: 03/15/2023 12:58   ECHOCARDIOGRAM COMPLETE  Result Date: 03/13/2023  ECHOCARDIOGRAM REPORT   Patient Name:   Andrea Huang Date of Exam: 03/13/2023 Medical Rec #:  [id number]           Height:       60.0 in Accession #:    [id number]          Weight:       147.3 lb Date of Birth:  1985-12-17          BSA:          1.639 m Patient Age:    36 years            BP:           120/88 mmHg Patient Gender: F                   HR:           94 bpm. Exam Location:  [facility] Procedure: 2D Echo and Strain Analysis Indications:     Pulmonary Embolus I26.09  History:         Patient has no prior history of Echocardiogram examinations.  Sonographer:     Overton Mam RDCS, FASE Referring Phys:  [id number] Annice Needy Diagnosing Phys: Chilton Si MD  Sonographer Comments: Global longitudinal strain was attempted. IMPRESSIONS  1. Left ventricular ejection fraction, by estimation, is 60 to 65%. The left ventricle has normal function. The left ventricle has no regional wall motion abnormalities. There is mild concentric left ventricular hypertrophy. Left  ventricular diastolic parameters were normal.  2. Right ventricular systolic function is normal. The right ventricular size is normal. There is normal pulmonary artery systolic pressure.  3. The mitral valve is normal in structure. Trivial mitral valve regurgitation. No evidence of mitral stenosis.  4. The aortic valve is tricuspid. Aortic valve regurgitation is not visualized. No aortic stenosis is present.  5. The inferior vena cava is normal in size with greater than 50% respiratory variability, suggesting right atrial pressure of 3 mmHg. FINDINGS  Left Ventricle: Left ventricular ejection fraction, by estimation, is 60 to 65%. The left ventricle has normal function. The left ventricle has no regional wall motion abnormalities. Global longitudinal strain performed but not reported based on interpreter judgement due to suboptimal tracking. The left ventricular internal cavity size was normal in size. There is mild concentric left ventricular hypertrophy. Left ventricular diastolic parameters were normal. Indeterminate filling pressures. Right Ventricle: The right ventricular size is normal. No increase in right ventricular wall thickness. Right ventricular systolic function is normal. There is normal pulmonary artery systolic pressure. The tricuspid regurgitant velocity is 2.34 m/s, and  with an assumed right atrial pressure of 3 mmHg, the estimated right ventricular systolic pressure is 24.9 mmHg. Left Atrium: Left atrial size was normal in size. Right Atrium: Right atrial size was normal in size. Pericardium: There is no evidence of pericardial effusion. Mitral Valve: The mitral valve is normal in structure. Trivial mitral valve regurgitation. No evidence of mitral valve stenosis. Tricuspid Valve: The tricuspid valve is normal in structure. Tricuspid valve regurgitation is trivial. No evidence of tricuspid stenosis. Aortic Valve: The aortic valve is tricuspid. Aortic valve regurgitation is not visualized. No  aortic stenosis is present. Aortic valve peak gradient measures 7.4 mmHg. Pulmonic Valve: The pulmonic valve was normal in structure. Pulmonic valve regurgitation is trivial. No evidence of pulmonic stenosis. Aorta: The aortic root is normal in size and structure. Venous: The inferior vena cava is normal in size with greater than 50% respiratory  variability, suggesting right atrial pressure of 3 mmHg. IAS/Shunts: No atrial level shunt detected by color flow Doppler.  LEFT VENTRICLE PLAX 2D LVIDd:         3.50 cm   Diastology LVIDs:         2.40 cm   LV e' medial:    10.00 cm/s LV PW:         1.30 cm   LV E/e' medial:  8.8 LV IVS:        1.20 cm   LV e' lateral:   8.92 cm/s LVOT diam:     1.80 cm   LV E/e' lateral: 9.9 LV SV:         46 LV SV Index:   28 LVOT Area:     2.54 cm  RIGHT VENTRICLE RV Basal diam:  1.50 cm RV S prime:     16.30 cm/s TAPSE (M-mode): 1.9 cm LEFT ATRIUM             Index        RIGHT ATRIUM           Index LA diam:        2.90 cm 1.77 cm/m   RA Area:     10.30 cm LA Vol (A2C):   25.2 ml 15.38 ml/m  RA Volume:   19.60 ml  11.96 ml/m LA Vol (A4C):   20.0 ml 12.20 ml/m LA Biplane Vol: 23.4 ml 14.28 ml/m  AORTIC VALVE                 PULMONIC VALVE AV Area (Vmax): 2.19 cm     PV Vmax:          1.45 m/s AV Vmax:        136.00 cm/s  PV Peak grad:     8.4 mmHg AV Peak Grad:   7.4 mmHg     PR End Diast Vel: 4.75 msec LVOT Vmax:      117.00 cm/s LVOT Vmean:     79.400 cm/s LVOT VTI:       0.179 m  AORTA Ao Root diam: 2.30 cm Ao Asc diam:  2.10 cm MITRAL VALVE               TRICUSPID VALVE MV Area (PHT): 5.62 cm    TR Peak grad:   21.9 mmHg MV Decel Time: 135 msec    TR Vmax:        234.00 cm/s MV E velocity: 88.00 cm/s MV A velocity: 82.80 cm/s  SHUNTS MV E/A ratio:  1.06        Systemic VTI:  0.18 m                            Systemic Diam: 1.80 cm Chilton Si MD Electronically signed by Chilton Si MD Signature Date/Time: 03/13/2023/2:54:39 PM    Final    PERIPHERAL VASCULAR  CATHETERIZATION  Result Date: 03/12/2023 See surgical note for result.  US Venous Img Lower Bilateral (DVT)  Result Date: 03/12/2023 CLINICAL DATA:  Pulmonary embolus. EXAM: BILATERAL LOWER EXTREMITY VENOUS DOPPLER ULTRASOUND TECHNIQUE: Gray-scale sonography with compression, as well as color and duplex ultrasound, were performed to evaluate the deep venous system(s) from the level of the common femoral vein through the popliteal and proximal calf veins. COMPARISON:  None Available. FINDINGS: VENOUS Normal compressibility of the common femoral, superficial femoral, and popliteal veins, as well as the visualized calf veins. Visualized  portions of profunda femoral vein and great saphenous vein unremarkable. No filling defects to suggest DVT on grayscale or color Doppler imaging. Doppler waveforms show normal direction of venous flow, normal respiratory plasticity and response to augmentation. OTHER None. Limitations: none IMPRESSION: Negative. Electronically Signed   By: Charlett Nose M.D.   On: 03/12/2023 15:43   [place] CHEST (PLEURAL EFFUSION)  Result Date: 03/12/2023 CLINICAL DATA:  Patient presented to the hospital with chest pain, found to have pulmonary emboli. Patient also found to have bilateral small-to-moderate pleural effusions. Interventional radiology asked to assess the patient for possible thoracentesis. EXAM: CHEST ULTRASOUND COMPARISON:  CTA chest 03/12/2023 FINDINGS: Limited ultrasound examination revealed insufficient fluid to safely perform thoracentesis. Small pocket of fluid identified in the right but there was significant lung flap obscuring percutaneous window. No procedure performed. Image findings discussed with patient. IMPRESSION: Insufficient fluid to safely perform thoracentesis. Small volume pleural effusion on the right with lung flap obscuring percutaneous window. No procedure performed. Ultrasound images obtained by Alwyn Ren NP and supervised by Dr. Juliette Alcide. Electronically  Signed   By: Olive Bass M.D.   On: 03/12/2023 14:03   CT Angio Chest Pulmonary Embolism (PE) W or WO Contrast  Result Date: 03/12/2023 CLINICAL DATA:  Pulmonary embolism suspected, high probability. Headache and chest pain. EXAM: CT ANGIOGRAPHY CHEST WITH CONTRAST TECHNIQUE: Multidetector CT imaging of the chest was performed using the standard protocol during bolus administration of intravenous contrast. Multiplanar CT image reconstructions and MIPs were obtained to evaluate the vascular anatomy. RADIATION DOSE REDUCTION: This exam was performed according to the departmental dose-optimization program which includes automated exposure control, adjustment of the mA and/or kV according to patient size and/or use of iterative reconstruction technique. CONTRAST:  55mL OMNIPAQUE IOHEXOL 350 MG/ML SOLN COMPARISON:  None Available. FINDINGS: Cardiovascular: Acute-appearing pulmonary embolism, nearly occlusive, within the central segmental pulmonary artery branches to the lingula (series 7, images 169-175). Additional partially occlusive pulmonary embolism within a central segmental pulmonary artery branch to the LEFT upper lobe (series 7, images 137 through 140). Questionable additional small/focal nonocclusive pulmonary embolism within a peripheral segmental pulmonary branch to the posterior LEFT lower lobe (series 7, image 225). No pulmonary embolism is seen within the LEFT main pulmonary artery. No pulmonary emboli are identified within the main, lobar or segmental pulmonary arteries to the RIGHT lung. RV/LV Ratio = greater than 1 Small pericardial effusion. No thoracic aortic aneurysm or evidence of aortic dissection. Mediastinum/Nodes: No mass or enlarged lymph nodes are appreciated within the mediastinum, perihilar or axillary regions. Esophagus is unremarkable. Trachea and central bronchi are unremarkable. Lungs/Pleura: RIGHT pleural effusion, small to moderate in size. RIGHT lower lobe consolidation is  likely associated compressive atelectasis. RIGHT lung appears otherwise clear. Mild atelectasis and small pleural effusion at the LEFT lung base. LEFT lung is otherwise clear. No pneumothorax is seen. Upper Abdomen: Limited images of the upper abdomen are unremarkable. Musculoskeletal: No chest wall abnormality. No acute or significant osseous findings. Review of the MIP images confirms the above findings. IMPRESSION: 1. Multiple acute-appearing pulmonary emboli within the LEFT lung, with involvement of the LEFT upper lobe and lingula, and with probable involvement of the LEFT lower lobe, largest pulmonary emboli being occlusive or nearly occlusive, as detailed above. 2. No RIGHT-sided pulmonary emboli identified. 3. RV/LV ratio = greater than 1, suggesting right heart strain. The presence of right heart strain has been associated with an increased risk of morbidity and mortality. Please refer to the "Code PE Focused" order  set in EPIC. 4. RIGHT pleural effusion, small to moderate in size. Adjacent RIGHT lower lobe consolidation is likely associated compressive atelectasis, less likely pneumonia. 5. Small LEFT pleural effusion and associated mild atelectasis. 6. Small pericardial effusion. Critical Value/emergent results were called by telephone at the time of interpretation on 03/12/2023 at 10:55 am to provider Lorretta Harp , who verbally acknowledged these results. Electronically Signed   By: Bary Richard M.D.   On: 03/12/2023 10:57   DG Chest Portable 1 View  Result Date: 03/12/2023 CLINICAL DATA:  Short of breath, headache, chest pain EXAM: PORTABLE CHEST 1 VIEW COMPARISON:  Prior chest x-ray 03/09/2023 FINDINGS: Interval progression of bilateral pleural effusions which are now small to moderate in volume. There are associated bibasilar airspace opacities which may reflect atelectasis and/or infiltrates. Cardiac and mediastinal contours are unchanged and grossly within normal limits. No pulmonary edema. No  acute osseous abnormality. IMPRESSION: Small to moderate bilateral pleural effusions with associated bibasilar atelectasis versus infiltrates. Findings are progressed compared to the x-ray dated 03/09/2023. Electronically Signed   By: Malachy Moan M.D.   On: 03/12/2023 07:49   DG Chest 2 View  Result Date: 03/09/2023 CLINICAL DATA:  Chest pain upon deep inspiration. EXAM: CHEST - 2 VIEW COMPARISON:  December 26, 2012 FINDINGS: The heart size and mediastinal contours are within normal limits. Low lung volumes are noted. Mild to moderate severity areas of atelectasis and/or infiltrate are seen within the bilateral lung bases. This is stable in appearance within the left lung base and represents a new finding within the right lung. A small left pleural effusion is noted. No pneumothorax is identified. The visualized skeletal structures are unremarkable. IMPRESSION: 1. Mild to moderate severity bibasilar atelectasis and/or infiltrate, as described above. While this may be infectious in etiology, follow-up to resolution is recommended. 2. Small, stable left pleural effusion. Electronically Signed   By: Aram Candela M.D.   On: 03/09/2023 00:53    Microbiology: Results for orders placed or performed during the hospital encounter of 02/03/23  SARS Coronavirus 2 by RT PCR (hospital order, performed in [facility] hospital lab) *cepheid single result test* Anterior Nasal Swab     Status: None   Collection Time: 02/03/23 11:34 AM   Specimen: Anterior Nasal Swab  Result Value Ref Range Status   SARS Coronavirus 2 by RT PCR NEGATIVE NEGATIVE Final    Comment: (NOTE) SARS-CoV-2 target nucleic acids are NOT DETECTED.  The SARS-CoV-2 RNA is generally detectable in upper and lower respiratory specimens during the acute phase of infection. The lowest concentration of SARS-CoV-2 viral copies this assay can detect is 250 copies / mL. A negative result does not preclude SARS-CoV-2 infection and should not be  used as the sole basis for treatment or other patient management decisions.  A negative result may occur with improper specimen collection / handling, submission of specimen other than nasopharyngeal swab, presence of viral mutation(s) within the areas targeted by this assay, and inadequate number of viral copies (<250 copies / mL). A negative result must be combined with clinical observations, patient history, and epidemiological information.  Fact Sheet for Patients:   RoadLapTop.co.za  Fact Sheet for Healthcare Providers: http://kim-miller.com/  This test is not yet approved or  cleared by the [place] FDA and has been authorized for detection and/or diagnosis of SARS-CoV-2 by FDA under an Emergency Use Authorization (EUA).  This EUA will remain in effect (meaning this test can be used) for the duration of the COVID-19  declaration under Section 564(b)(1) of the Act, 21 U.S.C. section 360bbb-3(b)(1), unless the authorization is terminated or revoked sooner.  Performed at [facility], [address] [place], [place] [address]   Group A Strep by PCR     Status: None   Collection Time: 02/03/23 11:35 AM   Specimen: Throat; Sterile Swab  Result Value Ref Range Status   Group A Strep by PCR NOT DETECTED NOT DETECTED Final    Comment: Performed at [facility], [address] [place], [place] [address]    Labs: CBC: Recent Labs  Lab 03/09/23 0026 03/12/23 0720 03/13/23 0508 03/15/23 0507  WBC 5.7 9.1 6.0 4.2  HGB 9.6* 10.3* 9.1* 9.9*  HCT 30.3* 31.7* 27.8* 30.9*  MCV 81.5 79.1* 79.4* 79.8*  PLT 494* 260 264 302   Basic Metabolic Panel: Recent Labs  Lab 03/09/23 0026 03/12/23 0720 03/13/23 0508 03/14/23 0510 03/15/23 0507  NA 138 132* 135 131* 134*  K 3.1* 3.2* 3.6 4.2 4.4  CL 104 102 103 101 100  CO2 26 21* 24 24 25   GLUCOSE 99 102* 108* 93 94  BUN 10 10 10 11 9   CREATININE 0.75 0.61 0.64  0.69 0.67  CALCIUM 8.7* 8.9 8.3* 8.8* 9.3  MG  --  1.9 1.8  --   --    Liver Function Tests: Recent Labs  Lab 03/12/23 0720  AST 23  ALT 10  ALKPHOS 69  BILITOT 0.3  PROT 8.6*  ALBUMIN 3.3*   CBG: No results for input(s): "GLUCAP" in the last 168 hours.  Discharge time spent: greater than 30 minutes.  Signed: Pennie Banter, DO [facility] 03/15/2023

## 2023-03-15 NOTE — Consult Note (Signed)
[facility]  [phone] [phone]  ID: Andrea Huang OB: [birth year]  MR#: [id number]  AOZ#:[id number]  Patient Care Team: Inc, [facility] as PCP - General  CHIEF COMPLAINT: Unprovoked left lung pulmonary embolism.  INTERVAL HISTORY: Patient is a 37 year old female who presented to the hospital with increasing pleuritic chest pain and shortness of breath over the previous 1 to 2 weeks.  Subsequent workup revealed a nearly occlusive left lung pulmonary embolism with heart strain.  No lower extremity DVT was noted.  Patient does not have a personal or family history of blood clots.  She has no apparent transient risk factors.  She underwent thrombectomy over the weekend.  She continues to have chest pain, but admits that has significantly improved.  She has no neurologic complaints.  She denies any recent fevers or illnesses.  She has a good appetite and denies weight loss.  She has no cough or hemoptysis.  She denies any nausea, vomiting, constipation, or diarrhea.  She has no urinary complaints.  Patient otherwise feels well and offers no further specific complaints today.  REVIEW OF SYSTEMS:   Review of Systems  Constitutional: Negative.  Negative for fever, malaise/fatigue and weight loss.  Respiratory: Negative.  Negative for cough, hemoptysis and shortness of breath.   Cardiovascular:  Positive for chest pain. Negative for leg swelling.  Gastrointestinal: Negative.  Negative for abdominal pain.  Genitourinary: Negative.  Negative for dysuria.  Musculoskeletal: Negative.  Negative for back pain.  Skin: Negative.  Negative for rash.  Neurological: Negative.  Negative for dizziness, focal weakness, weakness and headaches.  Psychiatric/Behavioral: Negative.  The patient is not nervous/anxious.     As per HPI. Otherwise, a complete review of systems is negative.  PAST MEDICAL HISTORY: Past Medical History:  Diagnosis Date    Anemia    Hemorrhoid    HTN (hypertension)     PAST SURGICAL HISTORY: Past Surgical History:  Procedure Laterality Date   CESAREAN SECTION     PULMONARY THROMBECTOMY N/A 03/12/2023   Procedure: PULMONARY THROMBECTOMY;  Surgeon: Annice Needy, MD;  Location: [facility] INVASIVE CV LAB;  Service: Cardiovascular;  Laterality: N/A;    FAMILY HISTORY: Family History  Problem Relation Age of Onset   Diabetes Mother    Hypertension Mother    Diabetes Mellitus II Maternal Grandmother    Diabetes Mellitus II Maternal Grandfather     ADVANCED DIRECTIVES (Y/N):  @ADVDIR @  HEALTH MAINTENANCE: Social History   Tobacco Use   Smoking status: Every Day    Current packs/day: 0.00    Types: Cigarettes, E-cigarettes    Last attempt to quit: 2021    Years since quitting: 3.5   Smokeless tobacco: Never  Vaping Use   Vaping status: Every Day   Substances: Flavoring  Substance Use Topics   Alcohol use: Not Currently    Alcohol/week: 1.0 standard drink of alcohol    Types: 1 Shots of liquor per week    Comment: socially maybe once amonth   Drug use: Not Currently    Types: Marijuana    Comment: last use age 80     Colonoscopy:  PAP:  Bone density:  Lipid panel:  Allergies  Allergen Reactions   Other Itching    Pt reports had allergic reaction to something she was given for pain during birth labor, but pt reports unsure of name of medication.    Current Facility-Administered Medications  Medication Dose Route Frequency Provider Last Rate Last Admin  acetaminophen (TYLENOL) tablet 650 mg  650 mg Oral Q6H PRN Annice Needy, MD       amLODipine (NORVASC) tablet 5 mg  5 mg Oral Daily Annice Needy, MD   5 mg at 03/15/23 8413   apixaban (ELIQUIS) tablet 10 mg  10 mg Oral BID Esaw Grandchild A, DO   10 mg at 03/15/23 2440   Followed by   Melene Muller ON 03/20/2023] apixaban (ELIQUIS) tablet 5 mg  5 mg Oral BID Pennie Banter, DO       butalbital-acetaminophen-caffeine (FIORICET) [id number] MG per  tablet 1 tablet  1 tablet Oral Q6H PRN Esaw Grandchild A, DO   1 tablet at 03/15/23 6644   hydrALAZINE (APRESOLINE) injection 5 mg  5 mg Intravenous Q2H PRN Annice Needy, MD       meloxicam (MOBIC) tablet 15 mg  15 mg Oral Daily Esaw Grandchild A, DO       morphine (PF) 2 MG/ML injection 2 mg  2 mg Intravenous Q4H PRN Annice Needy, MD   2 mg at 03/13/23 0809   nitroGLYCERIN (NITROSTAT) SL tablet 0.4 mg  0.4 mg Sublingual Q5 min PRN Annice Needy, MD       ondansetron [facility]) injection 4 mg  4 mg Intravenous Q4H PRN Mansy, Jan A, MD       oxyCODONE-acetaminophen (PERCOCET/ROXICET) 5-325 MG per tablet 1 tablet  1 tablet Oral Q4H PRN Annice Needy, MD   1 tablet at 03/15/23 1159   saccharomyces boulardii (FLORASTOR) capsule 250 mg  250 mg Oral BID Esaw Grandchild A, DO   250 mg at 03/15/23 1159    OBJECTIVE: Vitals:   03/15/23 0816 03/15/23 1152  BP: 124/88 (!) 133/96  Pulse: (!) 113 (!) 112  Resp: 18 18  Temp: 98.7 F (37.1 C) 98.8 F (37.1 C)  SpO2: 100% 99%     Body mass index is 28.76 kg/m.    ECOG FS:1 - Symptomatic but completely ambulatory  General: Well-developed, well-nourished, no acute distress. Eyes: Pink conjunctiva, anicteric sclera. HEENT: Normocephalic, moist mucous membranes. Lungs: No audible wheezing or coughing. Heart: Regular rate and rhythm. Abdomen: Soft, nontender, no obvious distention. Musculoskeletal: No edema, cyanosis, or clubbing. Neuro: Alert, answering all questions appropriately. Cranial nerves grossly intact. Skin: No rashes or petechiae noted. Psych: Normal affect. Lymphatics: No cervical, calvicular, axillary or inguinal LAD.   LAB RESULTS:  Lab Results  Component Value Date   NA 134 (L) 03/15/2023   K 4.4 03/15/2023   CL 100 03/15/2023   CO2 25 03/15/2023   GLUCOSE 94 03/15/2023   BUN 9 03/15/2023   CREATININE 0.67 03/15/2023   CALCIUM 9.3 03/15/2023   PROT 8.6 (H) 03/12/2023   ALBUMIN 3.3 (L) 03/12/2023   AST 23 03/12/2023   ALT 10  03/12/2023   ALKPHOS 69 03/12/2023   BILITOT 0.3 03/12/2023   GFRNONAA >60 03/15/2023   GFRAA >60 09/10/2019    Lab Results  Component Value Date   WBC 4.2 03/15/2023   NEUTROABS 3.4 05/23/2015   HGB 9.9 (L) 03/15/2023   HCT 30.9 (L) 03/15/2023   MCV 79.8 (L) 03/15/2023   PLT 302 03/15/2023     STUDIES: DG Chest Port 1 View  Result Date: 03/15/2023 CLINICAL DATA:  Pleural effusion, right. EXAM: PORTABLE CHEST 1 VIEW COMPARISON:  03/12/2023 FINDINGS: Persistent densities at the right lung base compatible with right pleural fluid and compressive atelectasis. Improved aeration at the left lung base compared to the  previous examination. Upper lungs are clear without overt pulmonary edema. Heart size is stable. Trachea is midline. Negative for a pneumothorax. IMPRESSION: 1. Persistent right pleural fluid and compressive atelectasis. Right pleural effusion appears to be similar to the recent comparison examination. 2. Improved aeration at the left lung base. Electronically Signed   By: Richarda Overlie M.D.   On: 03/15/2023 12:58   ECHOCARDIOGRAM COMPLETE  Result Date: 03/13/2023    ECHOCARDIOGRAM REPORT   Patient Name:   Andrea Huang Date of Exam: 03/13/2023 Medical Rec #:  [id number]           Height:       60.0 in Accession #:    [id number]          Weight:       147.3 lb Date of Birth:  1986/06/20          BSA:          1.639 m Patient Age:    36 years            BP:           120/88 mmHg Patient Gender: F                   HR:           94 bpm. Exam Location:  [facility] Procedure: 2D Echo and Strain Analysis Indications:     Pulmonary Embolus I26.09  History:         Patient has no prior history of Echocardiogram examinations.  Sonographer:     Overton Mam RDCS, FASE Referring Phys:  [id number] Annice Needy Diagnosing Phys: Chilton Si MD  Sonographer Comments: Global longitudinal strain was attempted. IMPRESSIONS  1. Left ventricular ejection fraction, by estimation, is 60 to 65%. The left  ventricle has normal function. The left ventricle has no regional wall motion abnormalities. There is mild concentric left ventricular hypertrophy. Left ventricular diastolic parameters were normal.  2. Right ventricular systolic function is normal. The right ventricular size is normal. There is normal pulmonary artery systolic pressure.  3. The mitral valve is normal in structure. Trivial mitral valve regurgitation. No evidence of mitral stenosis.  4. The aortic valve is tricuspid. Aortic valve regurgitation is not visualized. No aortic stenosis is present.  5. The inferior vena cava is normal in size with greater than 50% respiratory variability, suggesting right atrial pressure of 3 mmHg. FINDINGS  Left Ventricle: Left ventricular ejection fraction, by estimation, is 60 to 65%. The left ventricle has normal function. The left ventricle has no regional wall motion abnormalities. Global longitudinal strain performed but not reported based on interpreter judgement due to suboptimal tracking. The left ventricular internal cavity size was normal in size. There is mild concentric left ventricular hypertrophy. Left ventricular diastolic parameters were normal. Indeterminate filling pressures. Right Ventricle: The right ventricular size is normal. No increase in right ventricular wall thickness. Right ventricular systolic function is normal. There is normal pulmonary artery systolic pressure. The tricuspid regurgitant velocity is 2.34 m/s, and  with an assumed right atrial pressure of 3 mmHg, the estimated right ventricular systolic pressure is 24.9 mmHg. Left Atrium: Left atrial size was normal in size. Right Atrium: Right atrial size was normal in size. Pericardium: There is no evidence of pericardial effusion. Mitral Valve: The mitral valve is normal in structure. Trivial mitral valve regurgitation. No evidence of mitral valve stenosis. Tricuspid Valve: The tricuspid valve is normal in structure. Tricuspid valve  regurgitation  is trivial. No evidence of tricuspid stenosis. Aortic Valve: The aortic valve is tricuspid. Aortic valve regurgitation is not visualized. No aortic stenosis is present. Aortic valve peak gradient measures 7.4 mmHg. Pulmonic Valve: The pulmonic valve was normal in structure. Pulmonic valve regurgitation is trivial. No evidence of pulmonic stenosis. Aorta: The aortic root is normal in size and structure. Venous: The inferior vena cava is normal in size with greater than 50% respiratory variability, suggesting right atrial pressure of 3 mmHg. IAS/Shunts: No atrial level shunt detected by color flow Doppler.  LEFT VENTRICLE PLAX 2D LVIDd:         3.50 cm   Diastology LVIDs:         2.40 cm   LV e' medial:    10.00 cm/s LV PW:         1.30 cm   LV E/e' medial:  8.8 LV IVS:        1.20 cm   LV e' lateral:   8.92 cm/s LVOT diam:     1.80 cm   LV E/e' lateral: 9.9 LV SV:         46 LV SV Index:   28 LVOT Area:     2.54 cm  RIGHT VENTRICLE RV Basal diam:  1.50 cm RV S prime:     16.30 cm/s TAPSE (M-mode): 1.9 cm LEFT ATRIUM             Index        RIGHT ATRIUM           Index LA diam:        2.90 cm 1.77 cm/m   RA Area:     10.30 cm LA Vol (A2C):   25.2 ml 15.38 ml/m  RA Volume:   19.60 ml  11.96 ml/m LA Vol (A4C):   20.0 ml 12.20 ml/m LA Biplane Vol: 23.4 ml 14.28 ml/m  AORTIC VALVE                 PULMONIC VALVE AV Area (Vmax): 2.19 cm     PV Vmax:          1.45 m/s AV Vmax:        136.00 cm/s  PV Peak grad:     8.4 mmHg AV Peak Grad:   7.4 mmHg     PR End Diast Vel: 4.75 msec LVOT Vmax:      117.00 cm/s LVOT Vmean:     79.400 cm/s LVOT VTI:       0.179 m  AORTA Ao Root diam: 2.30 cm Ao Asc diam:  2.10 cm MITRAL VALVE               TRICUSPID VALVE MV Area (PHT): 5.62 cm    TR Peak grad:   21.9 mmHg MV Decel Time: 135 msec    TR Vmax:        234.00 cm/s MV E velocity: 88.00 cm/s MV A velocity: 82.80 cm/s  SHUNTS MV E/A ratio:  1.06        Systemic VTI:  0.18 m                            Systemic  Diam: 1.80 cm Chilton Si MD Electronically signed by Chilton Si MD Signature Date/Time: 03/13/2023/2:54:39 PM    Final    PERIPHERAL VASCULAR CATHETERIZATION  Result Date: 03/12/2023 See surgical note for result.  US Venous Img Lower Bilateral (DVT)  Result Date: 03/12/2023 CLINICAL DATA:  Pulmonary embolus. EXAM: BILATERAL LOWER EXTREMITY VENOUS DOPPLER ULTRASOUND TECHNIQUE: Gray-scale sonography with compression, as well as color and duplex ultrasound, were performed to evaluate the deep venous system(s) from the level of the common femoral vein through the popliteal and proximal calf veins. COMPARISON:  None Available. FINDINGS: VENOUS Normal compressibility of the common femoral, superficial femoral, and popliteal veins, as well as the visualized calf veins. Visualized portions of profunda femoral vein and great saphenous vein unremarkable. No filling defects to suggest DVT on grayscale or color Doppler imaging. Doppler waveforms show normal direction of venous flow, normal respiratory plasticity and response to augmentation. OTHER None. Limitations: none IMPRESSION: Negative. Electronically Signed   By: Charlett Nose M.D.   On: 03/12/2023 15:43   [place] CHEST (PLEURAL EFFUSION)  Result Date: 03/12/2023 CLINICAL DATA:  Patient presented to the hospital with chest pain, found to have pulmonary emboli. Patient also found to have bilateral small-to-moderate pleural effusions. Interventional radiology asked to assess the patient for possible thoracentesis. EXAM: CHEST ULTRASOUND COMPARISON:  CTA chest 03/12/2023 FINDINGS: Limited ultrasound examination revealed insufficient fluid to safely perform thoracentesis. Small pocket of fluid identified in the right but there was significant lung flap obscuring percutaneous window. No procedure performed. Image findings discussed with patient. IMPRESSION: Insufficient fluid to safely perform thoracentesis. Small volume pleural effusion on the right with  lung flap obscuring percutaneous window. No procedure performed. Ultrasound images obtained by Alwyn Ren NP and supervised by Dr. Juliette Alcide. Electronically Signed   By: Olive Bass M.D.   On: 03/12/2023 14:03   CT Angio Chest Pulmonary Embolism (PE) W or WO Contrast  Result Date: 03/12/2023 CLINICAL DATA:  Pulmonary embolism suspected, high probability. Headache and chest pain. EXAM: CT ANGIOGRAPHY CHEST WITH CONTRAST TECHNIQUE: Multidetector CT imaging of the chest was performed using the standard protocol during bolus administration of intravenous contrast. Multiplanar CT image reconstructions and MIPs were obtained to evaluate the vascular anatomy. RADIATION DOSE REDUCTION: This exam was performed according to the departmental dose-optimization program which includes automated exposure control, adjustment of the mA and/or kV according to patient size and/or use of iterative reconstruction technique. CONTRAST:  55mL OMNIPAQUE IOHEXOL 350 MG/ML SOLN COMPARISON:  None Available. FINDINGS: Cardiovascular: Acute-appearing pulmonary embolism, nearly occlusive, within the central segmental pulmonary artery branches to the lingula (series 7, images 169-175). Additional partially occlusive pulmonary embolism within a central segmental pulmonary artery branch to the LEFT upper lobe (series 7, images 137 through 140). Questionable additional small/focal nonocclusive pulmonary embolism within a peripheral segmental pulmonary branch to the posterior LEFT lower lobe (series 7, image 225). No pulmonary embolism is seen within the LEFT main pulmonary artery. No pulmonary emboli are identified within the main, lobar or segmental pulmonary arteries to the RIGHT lung. RV/LV Ratio = greater than 1 Small pericardial effusion. No thoracic aortic aneurysm or evidence of aortic dissection. Mediastinum/Nodes: No mass or enlarged lymph nodes are appreciated within the mediastinum, perihilar or axillary regions. Esophagus is  unremarkable. Trachea and central bronchi are unremarkable. Lungs/Pleura: RIGHT pleural effusion, small to moderate in size. RIGHT lower lobe consolidation is likely associated compressive atelectasis. RIGHT lung appears otherwise clear. Mild atelectasis and small pleural effusion at the LEFT lung base. LEFT lung is otherwise clear. No pneumothorax is seen. Upper Abdomen: Limited images of the upper abdomen are unremarkable. Musculoskeletal: No chest wall abnormality. No acute or significant osseous findings. Review of the MIP images confirms the above findings. IMPRESSION: 1. Multiple acute-appearing pulmonary emboli  within the LEFT lung, with involvement of the LEFT upper lobe and lingula, and with probable involvement of the LEFT lower lobe, largest pulmonary emboli being occlusive or nearly occlusive, as detailed above. 2. No RIGHT-sided pulmonary emboli identified. 3. RV/LV ratio = greater than 1, suggesting right heart strain. The presence of right heart strain has been associated with an increased risk of morbidity and mortality. Please refer to the "Code PE Focused" order set in EPIC. 4. RIGHT pleural effusion, small to moderate in size. Adjacent RIGHT lower lobe consolidation is likely associated compressive atelectasis, less likely pneumonia. 5. Small LEFT pleural effusion and associated mild atelectasis. 6. Small pericardial effusion. Critical Value/emergent results were called by telephone at the time of interpretation on 03/12/2023 at 10:55 am to provider Lorretta Harp , who verbally acknowledged these results. Electronically Signed   By: Bary Richard M.D.   On: 03/12/2023 10:57   DG Chest Portable 1 View  Result Date: 03/12/2023 CLINICAL DATA:  Short of breath, headache, chest pain EXAM: PORTABLE CHEST 1 VIEW COMPARISON:  Prior chest x-ray 03/09/2023 FINDINGS: Interval progression of bilateral pleural effusions which are now small to moderate in volume. There are associated bibasilar airspace opacities  which may reflect atelectasis and/or infiltrates. Cardiac and mediastinal contours are unchanged and grossly within normal limits. No pulmonary edema. No acute osseous abnormality. IMPRESSION: Small to moderate bilateral pleural effusions with associated bibasilar atelectasis versus infiltrates. Findings are progressed compared to the x-ray dated 03/09/2023. Electronically Signed   By: Malachy Moan M.D.   On: 03/12/2023 07:49   DG Chest 2 View  Result Date: 03/09/2023 CLINICAL DATA:  Chest pain upon deep inspiration. EXAM: CHEST - 2 VIEW COMPARISON:  December 26, 2012 FINDINGS: The heart size and mediastinal contours are within normal limits. Low lung volumes are noted. Mild to moderate severity areas of atelectasis and/or infiltrate are seen within the bilateral lung bases. This is stable in appearance within the left lung base and represents a new finding within the right lung. A small left pleural effusion is noted. No pneumothorax is identified. The visualized skeletal structures are unremarkable. IMPRESSION: 1. Mild to moderate severity bibasilar atelectasis and/or infiltrate, as described above. While this may be infectious in etiology, follow-up to resolution is recommended. 2. Small, stable left pleural effusion. Electronically Signed   By: Aram Candela M.D.   On: 03/09/2023 00:53    ASSESSMENT:  Unprovoked pulmonary embolism.  PLAN:    Unprovoked pulmonary embolism: CT scan results from March 12, 2023 reviewed independently and report as above with a nearly occlusive left lung pulmonary embolism with heart strain.  No lower extremity DVT was noted.  Patient does not have a personal or family history of blood clots.  She has no apparent transient risk factors.  She underwent thrombectomy over the weekend.  She is symptomatically improved, but not back to baseline.  Unclear etiology.  Hypercoagulable workup has been initiated and much of it is pending at time of dictation.  Patient will require  a minimum of 6 months of anticoagulation, possibly longer depending on the results of her workup.  No further intervention is needed at this time.  Please discharge patient on Eliquis. Will arrange follow-up in the cancer center in 1 to 2 weeks. Positive lupus anticoagulant: Unclear clinical significance in the setting of acute PE and anticoagulation.  This is nondiagnostic for antiphospholipid syndrome.  Will repeat lab in 3 months to assess for persistence.  Eliquis as above. Disposition: Okay to discharge  from a hematology standpoint.  Follow-up in the cancer center as above.  Appreciate consult, will follow.   Jeralyn Ruths, MD   03/15/2023 2:40 PM

## 2023-03-15 NOTE — Progress Notes (Signed)
  Progress Note    03/15/2023 2:09 PM 3 Days Post-Op  Subjective:  Andrea Huang is a 37 y.o. female status POD # 3 from mechanical thrombectomy of symptomatic pulmonary emboli with right heart strain 03/12/23. Doing well on room air. Discussed in detail the  results from procedure. Patient to  transition to DOAC for 6 months minimum. Follow up with Dr. Wyn Quaker in 4 weeks.  On exam this morning the patient is resting comfortably in bed. She endorses continuing having pain on inspiration or while coughing. She endorses breathing better at rest. She remains on room air this morning. No other complaints over night. Vitals all remain stable.    Vitals:   03/15/23 0816 03/15/23 1152  BP: 124/88 (!) 133/96  Pulse: (!) 113 (!) 112  Resp: 18 18  Temp: 98.7 F (37.1 C) 98.8 F (37.1 C)  SpO2: 100% 99%   Physical Exam: Cardiac:  RRR, Normal S1, S2.  Lungs:  Unlabored Normal breathing at rest,  on auscultation clear but diminished in the bases.  Incisions:  Right groin with dressing, clean, dry and intact. No hematoma or seroma.  Extremities:  Bilateral lower extremities are warm to touch with palpable pulses Abdomen:  Positive bowel Sounds, soft, non tender and non distended.  Neurologic:  A&O x3. no gross focal neurologic deficits, normal speech   CBC    Component Value Date/Time   WBC 4.2 03/15/2023 0507   RBC 3.87 03/15/2023 0507   HGB 9.9 (L) 03/15/2023 0507   HGB 9.3 (L) 10/27/2011 1418   HCT 30.9 (L) 03/15/2023 0507   HCT 29.0 (L) 10/27/2011 1418   PLT 302 03/15/2023 0507   PLT 249 10/27/2011 1418   MCV 79.8 (L) 03/15/2023 0507   MCV 75 (L) 10/27/2011 1418   MCH 25.6 (L) 03/15/2023 0507   MCHC 32.0 03/15/2023 0507   RDW 16.3 (H) 03/15/2023 0507   RDW 15.6 (H) 10/27/2011 1418   LYMPHSABS 1.0 05/23/2015 1210   MONOABS 1.6 (H) 05/23/2015 1210   EOSABS 0.1 05/23/2015 1210   BASOSABS 0.0 05/23/2015 1210    BMET    Component Value Date/Time   NA 134 (L) 03/15/2023 0507    NA 137 10/27/2011 1418   K 4.4 03/15/2023 0507   K 4.2 10/27/2011 1418   CL 100 03/15/2023 0507   CL 105 10/27/2011 1418   CO2 25 03/15/2023 0507   CO2 26 10/27/2011 1418   GLUCOSE 94 03/15/2023 0507   GLUCOSE 82 10/27/2011 1418   BUN 9 03/15/2023 0507   BUN 16 10/27/2011 1418   CREATININE 0.67 03/15/2023 0507   CREATININE 0.77 10/27/2011 1418   CALCIUM 9.3 03/15/2023 0507   CALCIUM 9.6 10/27/2011 1418   GFRNONAA >60 03/15/2023 0507   GFRNONAA >60 10/27/2011 1418   GFRAA >60 09/10/2019 1253   GFRAA >60 10/27/2011 1418    INR    Component Value Date/Time   INR 1.2 03/12/2023 1056    No intake or output data in the 24 hours ending 03/15/23 1409   Assessment/Plan:  37 y.o. female is s/p mechanical pulmonary thrombectomy  3 Days Post-Op   PLAN: Continue DOAC  Pain medication as needed PT/OT Follow up in 4 weeks after discharge by Hospitalist team.   DVT prophylaxis:  Eliquis 10 mg twice daily for 7 days then convert to 5 mg twice daily.    Marcie [facility] 03/15/2023 2:09 PM

## 2023-03-15 NOTE — Progress Notes (Signed)
Pt notified that all the three physicians have signed off on her discharge and she is stable to go. Pt states, "I can't go because I'm still in pain." Explained to patient, that the doctor's believe her right side pain is musculoskeletal in nature and that she is being prescribed Mobic and Tylenol together for the pain. Then patient said she wouldn't be able to go home because she didn't have a ride (even though her Grandma just left 30 minutes prior to the conversation). Offered to give her a taxi voucher to help her home and patient refused it saying she would have her sister "leave work to come pick me up." Patient's IV removed and discharge instructions read in detail to patient. Asked her if she had any other questions and she said no.

## 2023-03-16 LAB — PROTEIN C, TOTAL: Protein C, Total: 82 % (ref 60–150)

## 2023-03-17 LAB — PROTHROMBIN GENE MUTATION

## 2023-03-29 ENCOUNTER — Inpatient Hospital Stay: Payer: Medicaid Other | Attending: Oncology | Admitting: Oncology

## 2023-04-01 ENCOUNTER — Encounter: Payer: Self-pay | Admitting: Oncology

## 2023-04-07 ENCOUNTER — Institutional Professional Consult (permissible substitution): Payer: Medicaid Other | Admitting: Pulmonary Disease

## 2023-04-16 ENCOUNTER — Inpatient Hospital Stay: Payer: Medicaid Other | Admitting: Oncology

## 2023-04-19 ENCOUNTER — Ambulatory Visit: Payer: Medicaid Other

## 2023-04-20 ENCOUNTER — Institutional Professional Consult (permissible substitution): Payer: Medicaid Other | Admitting: Pulmonary Disease

## 2023-07-07 ENCOUNTER — Emergency Department: Payer: Medicaid Other

## 2023-07-07 ENCOUNTER — Other Ambulatory Visit: Payer: Self-pay

## 2023-07-07 ENCOUNTER — Emergency Department
Admission: EM | Admit: 2023-07-07 | Discharge: 2023-07-08 | Disposition: A | Discharge status: Home / Self Care | Payer: Medicaid Other | Attending: Emergency Medicine | Admitting: Emergency Medicine

## 2023-07-07 DIAGNOSIS — I10 Essential (primary) hypertension: Secondary | ICD-10-CM | POA: Diagnosis not present

## 2023-07-07 DIAGNOSIS — Z7901 Long term (current) use of anticoagulants: Secondary | ICD-10-CM | POA: Insufficient documentation

## 2023-07-07 DIAGNOSIS — R0781 Pleurodynia: Secondary | ICD-10-CM

## 2023-07-07 DIAGNOSIS — R079 Chest pain, unspecified: Secondary | ICD-10-CM | POA: Diagnosis present

## 2023-07-07 DIAGNOSIS — Z86711 Personal history of pulmonary embolism: Secondary | ICD-10-CM | POA: Diagnosis not present

## 2023-07-07 LAB — BASIC METABOLIC PANEL
Anion gap: 4 — ABNORMAL LOW (ref 5–15)
BUN: 10 mg/dL (ref 6–20)
CO2: 27 mmol/L (ref 22–32)
Calcium: 8.4 mg/dL — ABNORMAL LOW (ref 8.9–10.3)
Chloride: 105 mmol/L (ref 98–111)
Creatinine, Ser: 0.67 mg/dL (ref 0.44–1.00)
GFR, Estimated: >60 mL/min (ref >60)
Glucose, Bld: 88 mg/dL (ref 70–99)
Potassium: 3.5 mmol/L (ref 3.5–5.1)
Sodium: 136 mmol/L (ref 135–145)

## 2023-07-07 LAB — CBC WITH DIFFERENTIAL/PLATELET
Abs Immature Granulocytes: 0.01 10*3/uL (ref 0.00–0.07)
Basophils Absolute: 0 10*3/uL (ref 0.0–0.1)
Basophils Relative: 1 %
Eosinophils Absolute: 0.1 10*3/uL (ref 0.0–0.5)
Eosinophils Relative: 2 %
HCT: 30.9 % — ABNORMAL LOW (ref 36.0–46.0)
Hemoglobin: 9.8 g/dL — ABNORMAL LOW (ref 12.0–15.0)
Immature Granulocytes: 0 %
Lymphocytes Relative: 32 %
Lymphs Abs: 1.2 10*3/uL (ref 0.7–4.0)
MCH: 25.7 pg — ABNORMAL LOW (ref 26.0–34.0)
MCHC: 31.7 g/dL (ref 30.0–36.0)
MCV: 81.1 fL (ref 80.0–100.0)
Monocytes Absolute: 0.4 10*3/uL (ref 0.1–1.0)
Monocytes Relative: 12 %
Neutro Abs: 1.9 10*3/uL (ref 1.7–7.7)
Neutrophils Relative %: 53 %
Platelets: 299 10*3/uL (ref 150–400)
RBC: 3.81 MIL/uL — ABNORMAL LOW (ref 3.87–5.11)
RDW: 14.6 % (ref 11.5–15.5)
Smear Review: NORMAL
WBC: 3.6 10*3/uL — ABNORMAL LOW (ref 4.0–10.5)
nRBC: 0 % (ref 0.0–0.2)

## 2023-07-07 LAB — TROPONIN I (HIGH SENSITIVITY): Troponin I (High Sensitivity): 28 ng/L — ABNORMAL HIGH (ref <18)

## 2023-07-07 LAB — POC URINE PREG, ED: Preg Test, Ur: NEGATIVE

## 2023-07-07 MED ORDER — ACETAMINOPHEN 325 MG PO TABS
650.0000 mg | ORAL_TABLET | Freq: Once | ORAL | Status: AC
  Administered 2023-07-07: 650 mg via ORAL
  Filled 2023-07-07: qty 2

## 2023-07-07 MED ORDER — KETOROLAC TROMETHAMINE 15 MG/ML IJ SOLN
15.0000 mg | Freq: Once | INTRAMUSCULAR | Status: AC
  Administered 2023-07-07: 15 mg via INTRAVENOUS
  Filled 2023-07-07: qty 1

## 2023-07-07 MED ORDER — IOHEXOL 350 MG/ML SOLN
75.0000 mL | Freq: Once | INTRAVENOUS | Status: AC | PRN
  Administered 2023-07-07: 75 mL via INTRAVENOUS

## 2023-07-07 NOTE — ED Notes (Signed)
**   LAB RECEIVED CALL: BLUE TOP HEMOLYZED WITH NEED FOR RECOLLECT

## 2023-07-07 NOTE — ED Triage Notes (Signed)
Pt to ED via EMS from home, pt reports shortness of breath that began a month ago and upper back pain. Pt had thrombectomy back in August and was prescribed eliquis, pt reports not being able to take her eliquis for the past month due to losing the rx in a move. Pt states pain is worse with inspiration.

## 2023-07-08 LAB — TROPONIN I (HIGH SENSITIVITY): Troponin I (High Sensitivity): 27 ng/L — ABNORMAL HIGH (ref <18)

## 2023-07-08 MED ORDER — PREDNISONE 20 MG PO TABS: 40.0000 mg | ORAL_TABLET | Freq: Every day | ORAL | 0 refills | Status: AC

## 2023-07-08 MED ORDER — AMLODIPINE BESYLATE 5 MG PO TABS: 5.0000 mg | ORAL_TABLET | Freq: Every day | ORAL | 2 refills | Status: DC

## 2023-07-08 MED ORDER — APIXABAN 5 MG PO TABS: 5.0000 mg | ORAL_TABLET | Freq: Two times a day (BID) | ORAL | 0 refills | Status: DC

## 2023-07-08 NOTE — ED Provider Notes (Signed)
[facility] Provider Note    Event Date/Time   First MD Initiated Contact with Patient 07/07/23 2257     (approximate)   History   Shortness of Breath   HPI  Andrea Huang is a 37 year old female with history of hypertension, anemia, PE and July of this year presenting to the emergency department for evaluation of chest pain.  Patient reports that for the last month she has had chest pain that is worse with deep breaths and this under of her chest and upper back.  Reports she has been taking Tylenol with improvement, but ran out of Tylenol today and had worsened symptoms.  Does feel this is somewhat similar to when she had a pulmonary embolism earlier this year.  Says she ran out of her medications about a month ago.  I reviewed her discharge summary from 03/15/2023.  At that time, patient presented with pleuritic chest pain found to have a PE with right heart strain for which he underwent thrombectomy and was placed on heparin.  She was discharged on Eliquis to be continued for at least 6 months.    Physical Exam   Triage Vital Signs: ED Triage Vitals  Encounter Vitals Group     BP 07/07/23 2144 (!) 172/135     Systolic BP Percentile --      Diastolic BP Percentile --      Pulse Rate 07/07/23 2144 86     Resp 07/07/23 2144 (!) 22     Temp 07/07/23 2144 98.6 F (37 C)     Temp Source 07/07/23 2144 Oral     SpO2 07/07/23 2144 100 %     Weight 07/07/23 2143 120 lb (54.4 kg)     Height 07/07/23 2143 5' (1.524 m)     Head Circumference --      Peak Flow --      Pain Score 07/07/23 2143 10     Pain Loc --      Pain Education --      Exclude from Growth Chart --     Most recent vital signs: Vitals:   07/07/23 2144 07/08/23 0000  BP: (!) 172/135 (!) 158/112  Pulse: 86 79  Resp: (!) 22 16  Temp: 98.6 F (37 C)   SpO2: 100% 94%     General: Awake, interactive  CV:  Regular rate, good peripheral perfusion.  Chest wall: Some reproducible  tenderness to palpation over the anterior chest wall Resp:  Lungs clear to auscultation, unlabored respirations Abd:  , Nontender, nondistended Neuro:  Symmetric facial movement, fluid speech   ED Results / Procedures / Treatments   Labs (all labs ordered are listed, but only abnormal results are displayed) Labs Reviewed  BASIC METABOLIC PANEL - Abnormal; Notable for the following components:      Result Value   Calcium 8.4 (*)    Anion gap 4 (*)    All other components within normal limits  CBC WITH DIFFERENTIAL/PLATELET - Abnormal; Notable for the following components:   WBC 3.6 (*)    RBC 3.81 (*)    Hemoglobin 9.8 (*)    HCT 30.9 (*)    MCH 25.7 (*)    All other components within normal limits  TROPONIN I (HIGH SENSITIVITY) - Abnormal; Notable for the following components:   Troponin I (High Sensitivity) 28 (*)    All other components within normal limits  TROPONIN I (HIGH SENSITIVITY) - Abnormal; Notable for the following components:  Troponin I (High Sensitivity) 27 (*)    All other components within normal limits  POC URINE PREG, ED     EKG EKG independently reviewed interpreted by myself (ER attending) demonstrates:  EKG demonstrates normal sinus rhythm at a rate of 87, PR 166, QRS 74, QTc 478, no acute ST changes  RADIOLOGY Imaging independently reviewed and interpreted by myself demonstrates:  Chest x-Krystian Ferrentino with possible abnormal area over the mid lung/right lung base, better visualized on CT CTA of the chest without evidence of PE, previous right pleural effusion resolved with right basilar density felt to be most likely scarring or atelectasis.  Small pericardial effusion noted, no evidence of tamponade clinically.  PROCEDURES:  Critical Care performed: No  Procedures   MEDICATIONS ORDERED IN ED: Medications  iohexol (OMNIPAQUE) 350 MG/ML injection 75 mL (75 mLs Intravenous Contrast Given 07/07/23 2257)  acetaminophen (TYLENOL) tablet 650 mg (650 mg Oral  Given 07/07/23 2358)  ketorolac (TORADOL) 15 MG/ML injection 15 mg (15 mg Intravenous Given 07/07/23 2358)     IMPRESSION / MDM / ASSESSMENT AND PLAN / ED COURSE  I reviewed the triage vital signs and the nursing notes.  Differential diagnosis includes, but is not limited to, PE, pneumonia, pneumothorax, musculoskeletal strain, pericarditis, ACS  Patient's presentation is most consistent with acute presentation with potential threat to life or bodily function.  37 year old female presenting with pleuritic chest pain ongoing for a month.  Hypertensive on presentation, vitals otherwise stable.  Labs with stable anemia.  Mild troponin elevation, actually improved from prior and stable on repeat.  BMP reassuring.  Chest x-[facility] [place] with questionable area on the right side, but on CT appears to be more likely scarring or atelectasis.  Fortunately without evidence of PE.  EKG without acute ischemic findings or findings suggestive of pericarditis.  Suspect likely musculoskeletal or inflammatory etiology of patient's chest pain.  She was given Tylenol and Toradol here with improvement.  She was updated on the results of her workup.  She is comfortable with discharge home and outpatient follow-up.  Will DC with course of steroids for possible inflammatory component of her chest pain (avoiding NSAIDs with restarting Eliquis), refill for her Eliquis, as well as refill for her antihypertensives with her uncontrolled hypertension here.  Patient does report she has a primary care doctor that she will arrange close follow-up with.  Strict return precautions provided.  Patient discharged in stable condition.  FINAL CLINICAL IMPRESSION(S) / ED DIAGNOSES   Final diagnoses:  Pleuritic chest pain  Uncontrolled hypertension  History of pulmonary embolism     Rx / DC Orders   ED Discharge Orders          Ordered    amLODipine (NORVASC) 5 MG tablet  Daily        07/08/23 0140    apixaban (ELIQUIS) 5 MG TABS tablet  2  times daily        07/08/23 0140    predniSONE (DELTASONE) 20 MG tablet  Daily with breakfast        07/08/23 0140             Note:  This document was prepared using Dragon voice recognition software and may include unintentional dictation errors.   Trinna Post, MD 07/08/23 [phone]

## 2023-07-08 NOTE — ED Notes (Signed)
@   0206 called [facility]/ REP: Ashley/ Pt added to transport list

## 2023-07-08 NOTE — ED Notes (Signed)
This charge RN informed pt that she would not be able to stay in the room any longer, as she had been medically evaluated and cleared.  Pt upset, stating that we were taking too long to finish providing her care, and made her miss her ride.  This writer instructed pt that she would be able to get a bus pass, or call someone to pick her up.  Pt disgruntled, stating "this shit is terrible.  [facility]all a [facility] and need to fix your shit."  Pt ambulated out of room to lobby without difficulty.

## 2023-07-08 NOTE — Discharge Instructions (Addendum)
You were seen in the emergency department today for evaluation of your chest pain.  Fortunately we did not find an emergency cause for this.  I sent a short course of steroids that may help with your pain.  Have also sent a prescription to refill your Eliquis to prevent future blood clots and a prescription for a blood pressure medication.  Please follow-up closely with your primary care doctor for further evaluation.  Return to the ER for new or worsening symptoms.

## 2023-07-08 NOTE — ED Notes (Signed)
Patient discharged from ED by provider. Discharge instructions reviewed with patient and all questions answered. Patient transported from ED with [facility] to home in [place].

## 2024-03-13 ENCOUNTER — Emergency Department

## 2024-03-13 ENCOUNTER — Other Ambulatory Visit: Payer: Self-pay

## 2024-03-13 ENCOUNTER — Emergency Department
Admission: EM | Admit: 2024-03-13 | Discharge: 2024-03-13 | Disposition: A | Discharge status: Home / Self Care | Attending: Emergency Medicine | Admitting: Emergency Medicine

## 2024-03-13 DIAGNOSIS — M79671 Pain in right foot: Secondary | ICD-10-CM | POA: Insufficient documentation

## 2024-03-13 DIAGNOSIS — I10 Essential (primary) hypertension: Secondary | ICD-10-CM | POA: Diagnosis not present

## 2024-03-13 MED ORDER — OXYCODONE-ACETAMINOPHEN 5-325 MG PO TABS
1.0000 | ORAL_TABLET | Freq: Once | ORAL | Status: AC
  Administered 2024-03-13: 1 via ORAL
  Filled 2024-03-13: qty 1

## 2024-03-13 MED ORDER — AMLODIPINE BESYLATE 5 MG PO TABS: 5.0000 mg | ORAL_TABLET | Freq: Every day | ORAL | 2 refills | Status: AC

## 2024-03-13 MED ORDER — OXYCODONE-ACETAMINOPHEN 5-325 MG PO TABS: 1.0000 | ORAL_TABLET | ORAL | 0 refills | Status: DC | PRN

## 2024-03-13 NOTE — ED Triage Notes (Addendum)
 Pt to ed from home via POV for foot pain and swelling in the right foot x 1 month. Pt is still able to ambulate on it. Pt has been taking PO tylenol  with no relief. Pt is caox4, in no acute distress. Pt is non-compliant with her BP meds and she is hypertensive tonight.

## 2024-03-13 NOTE — ED Provider Notes (Signed)
 [facility] Provider Note    Event Date/Time   First MD Initiated Contact with Patient 03/13/24 0249     (approximate)   History   Chief Complaint Foot Pain (X 1 month)   HPI  Andrea Huang is a 38 y.o. female with past medical history of hypertension and PE who presents to the ED complaining of foot pain.  Patient reports that she has been dealing with pain and swelling around the base of her right foot for the past month.  She denies any trauma, has not noticed any redness or wounds to the foot and denies any fevers.  Pain is primarily at the base of her great toe and she describes pain when bearing weight on the foot.  She does report that history of DVT/PE, is not currently taking a blood thinner.  She also reports history of hypertension, is not currently taking medication for her blood pressure.     Physical Exam   Triage Vital Signs: ED Triage Vitals  Encounter Vitals Group     BP 03/13/24 0218 (!) 169/127     Girls Systolic BP Percentile --      Girls Diastolic BP Percentile --      Boys Systolic BP Percentile --      Boys Diastolic BP Percentile --      Pulse Rate 03/13/24 0218 95     Resp 03/13/24 0218 18     Temp 03/13/24 0218 (!) 97.5 F (36.4 C)     Temp Source 03/13/24 0218 Oral     SpO2 03/13/24 0218 100 %     Weight --      Height 03/13/24 0219 5' (1.524 m)     Head Circumference --      Peak Flow --      Pain Score 03/13/24 0219 10     Pain Loc --      Pain Education --      Exclude from Growth Chart --     Most recent vital signs: Vitals:   03/13/24 0218 03/13/24 0404  BP: (!) 169/127 (!) 149/110  Pulse: 95 87  Resp: 18 18  Temp: (!) 97.5 F (36.4 C)   SpO2: 100% 100%    Constitutional: Alert and oriented. Eyes: Conjunctivae are normal. Head: Atraumatic. Nose: No congestion/rhinnorhea. Mouth/Throat: Mucous membranes are moist.  Cardiovascular: Normal rate, regular rhythm. Grossly normal heart sounds.  2+  radial and DP pulses bilaterally. Respiratory: Normal respiratory effort.  No retractions. Lungs CTAB. Gastrointestinal: Soft and nontender. No distention. Musculoskeletal: Tenderness to palpation diffusely over the right foot, greatest at the base of great toe.  No erythema or warmth noted. Neurologic:  Normal speech and language. No gross focal neurologic deficits are appreciated.    ED Results / Procedures / Treatments   Labs (all labs ordered are listed, but only abnormal results are displayed) Labs Reviewed - No data to display   RADIOLOGY Right foot x-ray reviewed and interpreted by me with no fracture or dislocation.  PROCEDURES:  Critical Care performed: No  Procedures   MEDICATIONS ORDERED IN ED: Medications  oxyCODONE -acetaminophen  (PERCOCET/ROXICET) 5-325 MG per tablet 1 tablet (1 tablet Oral Given 03/13/24 0328)     IMPRESSION / MDM / ASSESSMENT AND PLAN / ED COURSE  I reviewed the triage vital signs and the nursing notes.  38 y.o. female with past medical history of hypertension and PE who presents to the ED complaining of pain and swelling of the right foot for the past month.  Patient's presentation is most consistent with acute complicated illness / injury requiring diagnostic workup.  Differential diagnosis includes, but is not limited to, fracture, dislocation, arthritis, gout, cellulitis, DVT.  Patient well-appearing and in no acute distress, vital signs remarkable for hypertension but otherwise reassuring.  She is not currently taking medication for her blood pressure, no symptoms to suggest hypertensive emergency.  She is neuro vastly intact to her right lower extremity with no signs of infection, x-ray shows no evidence of bony injury and ultrasound is negative for DVT.  No findings concerning for septic arthritis, patient placed in cam boot and we will refer to podiatry.  She was counseled to return to the ED for new or  worsening symptoms, also counseled to establish care with PCP for BP control.  Patient agrees with plan.      FINAL CLINICAL IMPRESSION(S) / ED DIAGNOSES   Final diagnoses:  Right foot pain  Uncontrolled hypertension     Rx / DC Orders   ED Discharge Orders          Ordered    oxyCODONE -acetaminophen  (PERCOCET) 5-325 MG tablet  Every 4 hours PRN        03/13/24 0405    amLODipine  (NORVASC ) 5 MG tablet  Daily        03/13/24 0406    Ambulatory Referral to Primary Care (Establish Care)        03/13/24 0407             Note:  This document was prepared using Dragon voice recognition software and may include unintentional dictation errors.   Willo Dunnings, MD 03/13/24 [phone]

## 2024-04-04 ENCOUNTER — Emergency Department

## 2024-04-04 ENCOUNTER — Encounter: Payer: Self-pay | Admitting: Emergency Medicine

## 2024-04-04 ENCOUNTER — Observation Stay
Admission: EM | Admit: 2024-04-04 | Discharge: 2024-04-07 | Disposition: A | Discharge status: Home / Self Care | Attending: Internal Medicine | Admitting: Internal Medicine

## 2024-04-04 ENCOUNTER — Other Ambulatory Visit: Payer: Self-pay

## 2024-04-04 DIAGNOSIS — I3139 Other pericardial effusion (noninflammatory): Secondary | ICD-10-CM | POA: Diagnosis not present

## 2024-04-04 DIAGNOSIS — Z1152 Encounter for screening for COVID-19: Secondary | ICD-10-CM | POA: Insufficient documentation

## 2024-04-04 DIAGNOSIS — I160 Hypertensive urgency: Secondary | ICD-10-CM | POA: Diagnosis not present

## 2024-04-04 DIAGNOSIS — F12920 Cannabis use, unspecified with intoxication, uncomplicated: Secondary | ICD-10-CM | POA: Insufficient documentation

## 2024-04-04 DIAGNOSIS — R079 Chest pain, unspecified: Secondary | ICD-10-CM | POA: Diagnosis present

## 2024-04-04 DIAGNOSIS — M79671 Pain in right foot: Secondary | ICD-10-CM | POA: Insufficient documentation

## 2024-04-04 DIAGNOSIS — Z86711 Personal history of pulmonary embolism: Secondary | ICD-10-CM | POA: Insufficient documentation

## 2024-04-04 DIAGNOSIS — Z720 Tobacco use: Secondary | ICD-10-CM | POA: Diagnosis present

## 2024-04-04 DIAGNOSIS — F10920 Alcohol use, unspecified with intoxication, uncomplicated: Secondary | ICD-10-CM | POA: Diagnosis not present

## 2024-04-04 DIAGNOSIS — R0602 Shortness of breath: Principal | ICD-10-CM

## 2024-04-04 DIAGNOSIS — J209 Acute bronchitis, unspecified: Principal | ICD-10-CM | POA: Insufficient documentation

## 2024-04-04 DIAGNOSIS — R062 Wheezing: Secondary | ICD-10-CM

## 2024-04-04 DIAGNOSIS — F17290 Nicotine dependence, other tobacco product, uncomplicated: Secondary | ICD-10-CM | POA: Insufficient documentation

## 2024-04-04 DIAGNOSIS — F17210 Nicotine dependence, cigarettes, uncomplicated: Secondary | ICD-10-CM | POA: Insufficient documentation

## 2024-04-04 LAB — RESP PANEL BY RT-PCR (RSV, FLU A&B, COVID)  RVPGX2
Influenza A by PCR: NEGATIVE
Influenza B by PCR: NEGATIVE
Resp Syncytial Virus by PCR: NEGATIVE
SARS Coronavirus 2 by RT PCR: NEGATIVE

## 2024-04-04 LAB — COMPREHENSIVE METABOLIC PANEL WITH GFR
ALT: 16 U/L (ref 0–44)
AST: 26 U/L (ref 15–41)
Albumin: 3.4 g/dL — ABNORMAL LOW (ref 3.5–5.0)
Alkaline Phosphatase: 68 U/L (ref 38–126)
Anion gap: 8 (ref 5–15)
BUN: 13 mg/dL (ref 6–20)
CO2: 26 mmol/L (ref 22–32)
Calcium: 8.8 mg/dL — ABNORMAL LOW (ref 8.9–10.3)
Chloride: 104 mmol/L (ref 98–111)
Creatinine, Ser: 0.71 mg/dL (ref 0.44–1.00)
GFR, Estimated: >60 mL/min (ref >60)
Glucose, Bld: 108 mg/dL — ABNORMAL HIGH (ref 70–99)
Potassium: 3.4 mmol/L — ABNORMAL LOW (ref 3.5–5.1)
Sodium: 138 mmol/L (ref 135–145)
Total Bilirubin: <0.2 mg/dL (ref 0.0–1.2)
Total Protein: 8.2 g/dL — ABNORMAL HIGH (ref 6.5–8.1)

## 2024-04-04 LAB — CBC WITH DIFFERENTIAL/PLATELET
Abs Immature Granulocytes: 0.02 K/uL (ref 0.00–0.07)
Basophils Absolute: 0 K/uL (ref 0.0–0.1)
Basophils Relative: 0 %
Eosinophils Absolute: 0 K/uL (ref 0.0–0.5)
Eosinophils Relative: 1 %
HCT: 33.1 % — ABNORMAL LOW (ref 36.0–46.0)
Hemoglobin: 10.3 g/dL — ABNORMAL LOW (ref 12.0–15.0)
Immature Granulocytes: 0 %
Lymphocytes Relative: 31 %
Lymphs Abs: 2 K/uL (ref 0.7–4.0)
MCH: 26.8 pg (ref 26.0–34.0)
MCHC: 31.1 g/dL (ref 30.0–36.0)
MCV: 86.2 fL (ref 80.0–100.0)
Monocytes Absolute: 0.7 K/uL (ref 0.1–1.0)
Monocytes Relative: 12 %
Neutro Abs: 3.5 K/uL (ref 1.7–7.7)
Neutrophils Relative %: 56 %
Platelets: 231 K/uL (ref 150–400)
RBC: 3.84 MIL/uL — ABNORMAL LOW (ref 3.87–5.11)
RDW: 14.7 % (ref 11.5–15.5)
WBC: 6.3 K/uL (ref 4.0–10.5)
nRBC: 0 % (ref 0.0–0.2)

## 2024-04-04 LAB — BRAIN NATRIURETIC PEPTIDE: B Natriuretic Peptide: 37 pg/mL (ref 0.0–100.0)

## 2024-04-04 LAB — BLOOD GAS, VENOUS
Acid-Base Excess: 1.5 mmol/L (ref 0.0–2.0)
Bicarbonate: 28.5 mmol/L — ABNORMAL HIGH (ref 20.0–28.0)
O2 Saturation: 60.4 %
Patient temperature: 37
pCO2, Ven: 54 mmHg (ref 44–60)
pH, Ven: 7.33 (ref 7.25–7.43)
pO2, Ven: 37 mmHg (ref 32–45)

## 2024-04-04 LAB — PROTIME-INR
INR: 1 (ref 0.8–1.2)
Prothrombin Time: 14.3 s (ref 11.4–15.2)

## 2024-04-04 LAB — HCG, QUANTITATIVE, PREGNANCY: hCG, Beta Chain, Quant, S: <1 m[IU]/mL (ref <5)

## 2024-04-04 MED ORDER — HYDROMORPHONE HCL 1 MG/ML IJ SOLN
0.5000 mg | Freq: Once | INTRAMUSCULAR | Status: AC
  Administered 2024-04-04: 0.5 mg via INTRAVENOUS
  Filled 2024-04-04: qty 0.5

## 2024-04-04 MED ORDER — IOHEXOL 350 MG/ML SOLN
75.0000 mL | Freq: Once | INTRAVENOUS | Status: AC | PRN
  Administered 2024-04-05: 75 mL via INTRAVENOUS

## 2024-04-04 MED ORDER — IPRATROPIUM-ALBUTEROL 0.5-2.5 (3) MG/3ML IN SOLN
RESPIRATORY_TRACT | Status: AC
  Filled 2024-04-04: qty 3

## 2024-04-04 MED ORDER — SODIUM CHLORIDE 0.9 % IV BOLUS
1000.0000 mL | Freq: Once | INTRAVENOUS | Status: AC
  Administered 2024-04-04: 1000 mL via INTRAVENOUS

## 2024-04-04 MED ORDER — IPRATROPIUM-ALBUTEROL 0.5-2.5 (3) MG/3ML IN SOLN
3.0000 mL | Freq: Once | RESPIRATORY_TRACT | Status: AC
  Administered 2024-04-04: 3 mL via RESPIRATORY_TRACT

## 2024-04-04 NOTE — ED Provider Notes (Signed)
 [facility] Provider Note    Event Date/Time   First MD Initiated Contact with Patient 04/04/24 2256     (approximate)   History   Shortness of Breath  Patient arrives from home w/ [facility] with increasing shortness of breath x2 days now. Hx of PE, ran out of blood thinner and has not taken it for 1 month. Patient c/o of chest and back pain on the L side. EMS states that upon arrival patient tachypneic, labored breathing, lungs sounding tight. Administered 1 duoneb, 3 albuterol  tx, 125 mg of solumedrol, 2 g of magnesium and 100 mls of fluid.EMS placed patient on 6 L of O2 per [place]. RA O2 saturation was 100%.    HPI Andrea Huang is a 38 y.o. female PMH hypertension, prior multifocal pulmonary embolisms on anticoagulation presents for evaluation of shortness of breath -.  Mass, patient's been feeling short of breath for the past 2 days.  Called an ambulance this evening, found to be very wheezy though not hypoxic.  Given albuterol  nebulizer treatments, 125 mg IV Solu-Medrol , 2 g IV magnesium and brought to emergency department. -On my evaluation, patient confirms above history.  Does also endorse some chest tightness and pain.  Tells me she has not been taking her anticoagulation for about 1 month.  Remains notably tachypneic, speaking in 5 word sentences.     Physical Exam   Triage Vital Signs: ED Triage Vitals  Encounter Vitals Group     BP --      Girls Systolic BP Percentile --      Girls Diastolic BP Percentile --      Boys Systolic BP Percentile --      Boys Diastolic BP Percentile --      Pulse Rate 04/04/24 2254 (!) 108     Resp 04/04/24 2254 19     Temp --      Temp src --      SpO2 04/04/24 2254 100 %     Weight 04/04/24 2255 119 lb 0.8 oz (54 kg)     Height 04/04/24 2255 5' (1.524 m)     Head Circumference --      Peak Flow --      Pain Score --      Pain Loc --      Pain Education --      Exclude from Growth Chart --     Most recent  vital signs: Vitals:   04/04/24 2320 04/05/24 0000  BP: (!) 156/105 (!) 154/113  Pulse: (!) 105 (!) 103  Resp: 19 16  Temp:    SpO2: 100% 100%     General: Awake, tachypneic, appears uncomfortable CV:  Good peripheral perfusion.  Tachycardic, regular rhythm, RP 2+ Resp:  Hypnic, 5 word sentences, some intermittent wheezing in bilateral lower lung fields, no focal coarse breath sounds, no crackles Abd:  No distention. Nontender to deep palpation throughout    ED Results / Procedures / Treatments   Labs (all labs ordered are listed, but only abnormal results are displayed) Labs Reviewed  CBC WITH DIFFERENTIAL/PLATELET - Abnormal; Notable for the following components:      Result Value   RBC 3.84 (*)    Hemoglobin 10.3 (*)    HCT 33.1 (*)    All other components within normal limits  BLOOD GAS, VENOUS - Abnormal; Notable for the following components:   Bicarbonate 28.5 (*)    All other components within normal limits  COMPREHENSIVE METABOLIC  PANEL WITH GFR - Abnormal; Notable for the following components:   Potassium 3.4 (*)    Glucose, Bld 108 (*)    Calcium 8.8 (*)    Total Protein 8.2 (*)    Albumin 3.4 (*)    All other components within normal limits  RESP PANEL BY RT-PCR (RSV, FLU A&B, COVID)  RVPGX2  PROTIME-INR  HCG, QUANTITATIVE, PREGNANCY  BRAIN NATRIURETIC PEPTIDE  LIPASE, BLOOD  TROPONIN I (HIGH SENSITIVITY)     EKG  Ecg = sinus tachycardia, rate 109, no gross ST elevation or depression, no significant repolarization normality, right axis deviation, normal intervals.  No clear evidence of ischemia nor arrhythmia on my interpretation.   RADIOLOGY X-ray interpreted myself and radiology report reviewed, no acute pathology identified.  CTA chest pending.    PROCEDURES:  Critical Care performed: No  Procedures   MEDICATIONS ORDERED IN ED: Medications  ipratropium-albuterol  (DUONEB) 0.5-2.5 (3) MG/3ML nebulizer solution 3 mL ( Nebulization Not  Given 04/04/24 2317)  sodium chloride  0.9 % bolus 1,000 mL (1,000 mLs Intravenous New Bag/Given 04/04/24 2345)  iohexol  (OMNIPAQUE ) 350 MG/ML injection 75 mL (75 mLs Intravenous Contrast Given 04/05/24 0016)  HYDROmorphone  (DILAUDID ) injection 0.5 mg (0.5 mg Intravenous Given 04/04/24 2357)     IMPRESSION / MDM / ASSESSMENT AND PLAN / ED COURSE  I reviewed the triage vital signs and the nursing notes.                              DDX/MDM/AP: Differential diagnosis includes, but is not limited to, recurrent pulmonary emboli, bronchospasm/reactive airway disease, consider underlying pneumonia or viral syndrome including influenza or COVID-19, doubt ACS or pneumothorax.  Somewhat hypertensive on arrival though exam not consistent with flash pulmonary edema, do not suspect hypertensive emergency as etiology at this time, will continue to monitor.  Plan: - DuoNeb - EKG -  labs - Chest x-ray - CTA chest - reassess  Patient's presentation is most consistent with acute presentation with potential threat to life or bodily function.  The patient is on the cardiac monitor to evaluate for evidence of arrhythmia and/or significant heart rate changes.  ED course below.  Laboratory workup unremarkable.  Patient signed out to oncoming ED provider pending results of CT chest.  If unremarkable and symptomatically improved, would be stable for discharge home with course of steroids for treatment of apparent reactive airway disease.  Clinical Course as of 04/05/24 0034  Tue Apr 04, 2024  2320 CXR: IMPRESSION: 1. No acute findings.   [MM]  2329 Hcg neg [MM]    Clinical Course User Index [MM] Clarine Ozell LABOR, MD     FINAL CLINICAL IMPRESSION(S) / ED DIAGNOSES   Final diagnoses:  Shortness of breath  Wheezing  Chest pain, unspecified type     Rx / DC Orders   ED Discharge Orders     None        Note:  This document was prepared using Dragon voice recognition software and may include  unintentional dictation errors.   Clarine Ozell LABOR, MD 04/05/24 [phone]

## 2024-04-04 NOTE — ED Triage Notes (Addendum)
 Patient arrives from home w/ [facility] with increasing shortness of breath x2 days now. Hx of PE, ran out of blood thinner and has not taken it for 1 month. Patient c/o of chest and back pain on the L side. EMS states that upon arrival patient tachypneic, labored breathing, lungs sounding tight. Administered 1 duoneb, 3 albuterol  tx, 125 mg of solumedrol, 2 g of magnesium and 100 mls of fluid.EMS placed patient on 6 L of O2 per [place]. RA O2 saturation was 100%.

## 2024-04-05 ENCOUNTER — Emergency Department

## 2024-04-05 ENCOUNTER — Observation Stay (HOSPITAL_BASED_OUTPATIENT_CLINIC_OR_DEPARTMENT_OTHER)
Admit: 2024-04-05 | Discharge: 2024-04-05 | Disposition: A | Discharge status: Home / Self Care | Attending: Internal Medicine

## 2024-04-05 DIAGNOSIS — R079 Chest pain, unspecified: Secondary | ICD-10-CM | POA: Diagnosis not present

## 2024-04-05 DIAGNOSIS — I3139 Other pericardial effusion (noninflammatory): Secondary | ICD-10-CM | POA: Insufficient documentation

## 2024-04-05 DIAGNOSIS — J209 Acute bronchitis, unspecified: Secondary | ICD-10-CM

## 2024-04-05 DIAGNOSIS — Z86711 Personal history of pulmonary embolism: Secondary | ICD-10-CM | POA: Insufficient documentation

## 2024-04-05 LAB — LIPASE, BLOOD: Lipase: 38 U/L (ref 11–51)

## 2024-04-05 LAB — BASIC METABOLIC PANEL WITH GFR
Anion gap: 6 (ref 5–15)
BUN: 7 mg/dL (ref 6–20)
CO2: 21 mmol/L — ABNORMAL LOW (ref 22–32)
Calcium: 8.3 mg/dL — ABNORMAL LOW (ref 8.9–10.3)
Chloride: 108 mmol/L (ref 98–111)
Creatinine, Ser: 0.52 mg/dL (ref 0.44–1.00)
GFR, Estimated: >60 mL/min (ref >60)
Glucose, Bld: 132 mg/dL — ABNORMAL HIGH (ref 70–99)
Potassium: 4.4 mmol/L (ref 3.5–5.1)
Sodium: 135 mmol/L (ref 135–145)

## 2024-04-05 LAB — MAGNESIUM: Magnesium: 2.2 mg/dL (ref 1.7–2.4)

## 2024-04-05 LAB — C-REACTIVE PROTEIN: CRP: 2.7 mg/dL — ABNORMAL HIGH (ref <1.0)

## 2024-04-05 LAB — ECHOCARDIOGRAM COMPLETE
AR max vel: 2.18 cm2
AV Area VTI: 2.25 cm2
AV Area mean vel: 2.06 cm2
AV Mean grad: 5 mmHg
AV Peak grad: 9 mmHg
Ao pk vel: 1.5 m/s
Area-P 1/2: 3.6 cm2
Height: 60 in
MV VTI: 2.5 cm2
S' Lateral: 2.5 cm
Weight: 1904.77 [oz_av]

## 2024-04-05 LAB — SEDIMENTATION RATE: Sed Rate: 65 mm/h — ABNORMAL HIGH (ref 0–20)

## 2024-04-05 LAB — TROPONIN I (HIGH SENSITIVITY)
Troponin I (High Sensitivity): 15 ng/L (ref <18)
Troponin I (High Sensitivity): 17 ng/L (ref <18)

## 2024-04-05 LAB — HIV ANTIBODY (ROUTINE TESTING W REFLEX): HIV Screen 4th Generation wRfx: NONREACTIVE

## 2024-04-05 MED ORDER — OXYCODONE-ACETAMINOPHEN 5-325 MG PO TABS
1.0000 | ORAL_TABLET | ORAL | Status: DC | PRN
  Administered 2024-04-05 – 2024-04-07 (×9): 1 via ORAL
  Filled 2024-04-05 (×9): qty 1

## 2024-04-05 MED ORDER — KETOROLAC TROMETHAMINE 30 MG/ML IJ SOLN: 30.0000 mg | Freq: Four times a day (QID) | INTRAMUSCULAR | Status: DC | PRN

## 2024-04-05 MED ORDER — ALBUTEROL SULFATE (2.5 MG/3ML) 0.083% IN NEBU: 2.5000 mg | INHALATION_SOLUTION | RESPIRATORY_TRACT | Status: DC | PRN

## 2024-04-05 MED ORDER — METOPROLOL TARTRATE 25 MG PO TABS
25.0000 mg | ORAL_TABLET | Freq: Two times a day (BID) | ORAL | Status: DC
  Administered 2024-04-05 – 2024-04-07 (×5): 25 mg via ORAL
  Filled 2024-04-05 (×5): qty 1

## 2024-04-05 MED ORDER — LABETALOL HCL 5 MG/ML IV SOLN
20.0000 mg | INTRAVENOUS | Status: DC | PRN
  Filled 2024-04-05: qty 4

## 2024-04-05 MED ORDER — CILOSTAZOL 100 MG PO TABS
100.0000 mg | ORAL_TABLET | Freq: Two times a day (BID) | ORAL | Status: DC
  Administered 2024-04-06 – 2024-04-07 (×4): 100 mg via ORAL
  Filled 2024-04-05 (×4): qty 1

## 2024-04-05 MED ORDER — SODIUM CHLORIDE 0.9 % IV BOLUS
1000.0000 mL | Freq: Once | INTRAVENOUS | Status: AC
  Administered 2024-04-05: 1000 mL via INTRAVENOUS

## 2024-04-05 MED ORDER — KETOROLAC TROMETHAMINE 30 MG/ML IJ SOLN
30.0000 mg | Freq: Four times a day (QID) | INTRAMUSCULAR | Status: DC | PRN
  Administered 2024-04-06 (×2): 30 mg via INTRAVENOUS
  Filled 2024-04-05 (×2): qty 1

## 2024-04-05 MED ORDER — ACETAMINOPHEN 650 MG RE SUPP: 650.0000 mg | Freq: Four times a day (QID) | RECTAL | Status: DC | PRN

## 2024-04-05 MED ORDER — ONDANSETRON HCL 4 MG PO TABS: 4.0000 mg | ORAL_TABLET | Freq: Four times a day (QID) | ORAL | Status: DC | PRN

## 2024-04-05 MED ORDER — ACETAMINOPHEN 325 MG PO TABS
650.0000 mg | ORAL_TABLET | Freq: Four times a day (QID) | ORAL | Status: DC | PRN
  Administered 2024-04-06: 650 mg via ORAL
  Filled 2024-04-05: qty 2

## 2024-04-05 MED ORDER — ENOXAPARIN SODIUM 40 MG/0.4ML IJ SOSY
40.0000 mg | PREFILLED_SYRINGE | INTRAMUSCULAR | Status: DC
  Administered 2024-04-05 – 2024-04-07 (×3): 40 mg via SUBCUTANEOUS
  Filled 2024-04-05 (×3): qty 0.4

## 2024-04-05 MED ORDER — ONDANSETRON HCL 4 MG/2ML IJ SOLN: 4.0000 mg | Freq: Four times a day (QID) | INTRAMUSCULAR | Status: DC | PRN

## 2024-04-05 MED ORDER — KETOROLAC TROMETHAMINE 30 MG/ML IJ SOLN
30.0000 mg | Freq: Once | INTRAMUSCULAR | Status: AC
  Administered 2024-04-05: 30 mg via INTRAVENOUS
  Filled 2024-04-05: qty 1

## 2024-04-05 MED ORDER — PREDNISONE 20 MG PO TABS
40.0000 mg | ORAL_TABLET | Freq: Every day | ORAL | Status: DC
  Administered 2024-04-05 – 2024-04-06 (×2): 40 mg via ORAL
  Filled 2024-04-05 (×2): qty 2

## 2024-04-05 MED ORDER — SODIUM BICARBONATE 650 MG PO TABS
650.0000 mg | ORAL_TABLET | Freq: Two times a day (BID) | ORAL | Status: DC
  Administered 2024-04-05 – 2024-04-07 (×5): 650 mg via ORAL
  Filled 2024-04-05 (×5): qty 1

## 2024-04-05 MED ORDER — AMLODIPINE BESYLATE 5 MG PO TABS
5.0000 mg | ORAL_TABLET | Freq: Every day | ORAL | Status: DC
  Administered 2024-04-05 – 2024-04-07 (×3): 5 mg via ORAL
  Filled 2024-04-05 (×3): qty 1

## 2024-04-05 NOTE — H&P (Signed)
 History and Physical    [name] FMW:[id number] DOB: 06/14/1986 DOA: 04/04/2024 DOS: the patient was seen and examined on 04/05/2024 PCP: [facility]  Patient coming from: Home  Chief Complaint:  Chief Complaint  Patient presents with   Shortness of Breath    HPI: Andrea Huang is a 38 y.o. female with medical history significant for PE s/p thrombectomy 03/2023, HTN, vape use who presented to the ED with exertional chest pain and tachypnea.  She arrived by EMS who administered DuoNebs en route with some improvement. In the ED: She was afebrile, persistently tachypneic, hypertensive to 178/124, not hypoxic. Labs were unremarkable except for mild anemia of 10.3 and mild hypokalemia of 3.4. Troponin 17 and BNP 37 Lipase and LFTs normal VBG normal and respiratory viral panel negative.EKG Showed sinus tachycardia at 109 with nonspecific ST-T wave changes.  CTA chest PE protocol negative for PE but showed a trace to small volume pericardial effusion and otherwise no acute process  Patient was treated with additional DuoNebs, Dilaudid  and Toradol  for pain and given an NS bolus.  She improved with treatment but with ambulation she became tachypneic and tachycardic. Admission requested     Review of Systems: As mentioned in the history of present illness. All other systems reviewed and are negative.  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 She has never used smokeless tobacco. She  reports that she does not currently use alcohol after a past usage of about 1.0 standard drink of alcohol per week. She reports that she does not currently use drugs after having used the following drugs: Marijuana.  Allergies  Allergen Reactions   Other Itching    Pt reports had allergic reaction to something she was given for pain during birth labor, but pt reports unsure of name of medication.    Family History  Problem Relation Age of Onset   Diabetes Mother    Hypertension Mother    Diabetes Mellitus II Maternal Grandmother    Diabetes Mellitus II Maternal Grandfather     Prior to Admission medications   Medication Sig Start Date End Date Taking? Authorizing Provider  acetaminophen  (TYLENOL ) 500 MG tablet Take 2 tablets (1,000 mg total) by mouth every 8 (eight) hours. 03/15/23   Fausto Sor A, DO  amLODipine  (NORVASC ) 5 MG tablet Take 1 tablet (5 mg total) by mouth daily. 03/13/24 06/11/24  Willo Dunnings, MD  apixaban  (ELIQUIS ) 5 MG TABS tablet Take 1 tablet (5 mg total) by mouth 2 (two) times daily. 07/08/23 08/07/23  Levander Slate, MD  meloxicam  (MOBIC ) 15 MG tablet Take 1 tablet (15 mg total) by mouth daily. 03/16/23   Fausto Sor LABOR, DO  oxyCODONE -acetaminophen  (PERCOCET) 5-325 MG tablet Take 1 tablet by mouth every 4 (four) hours as needed. 03/13/24 03/13/25  Willo Dunnings, MD  saccharomyces boulardii (FLORASTOR) 250 MG capsule Take 1 capsule (250 mg total) by mouth 2 (two) times daily. 03/15/23   Fausto Sor LABOR, DO    Physical Exam:  Vitals:   04/05/24 0245 04/05/24 0300 04/05/24 0341 04/05/24 0400  BP:  (!) 142/107  (!) 145/109  Pulse: 90 91  96  Resp: 15 (!) 27    Temp:   97.8 F (36.6 C)   TempSrc:   Oral   SpO2: 100% 100%  100%  Weight:      Height:       Physical Exam Vitals and nursing note reviewed.  Constitutional:      General: She is not in acute distress.    Comments: Conversational dyspnea  HENT:     Head: Normocephalic and atraumatic.   Cardiovascular:     Rate and Rhythm: Normal rate and regular rhythm.     Heart sounds: Normal heart sounds.  Pulmonary:     Effort: Pulmonary effort is normal.     Breath sounds: Normal breath sounds.  Abdominal:     Palpations: Abdomen is soft.     Tenderness: There is no abdominal tenderness.  Neurological:     Mental Status: Mental status is at baseline.     Labs on Admission: I have personally reviewed following labs and imaging studies  CBC: Recent Labs  Lab 04/04/24 2253  WBC 6.3  NEUTROABS 3.5  HGB 10.3*  HCT 33.1*  MCV 86.2  PLT 231   Basic Metabolic Panel: Recent Labs  Lab 04/04/24 2253  NA 138  K 3.4*  CL 104  CO2 26  GLUCOSE 108*  BUN 13  CREATININE 0.71  CALCIUM 8.8*   GFR: Estimated Creatinine Clearance: 70.5 mL/min (by C-G formula based on SCr of 0.71 mg/dL). Liver Function Tests: Recent Labs  Lab 04/04/24 2253  AST 26  ALT 16  ALKPHOS 68  BILITOT <0.2  PROT 8.2*  ALBUMIN 3.4*   Recent Labs  Lab 04/04/24 2253  LIPASE 38   No results for input(s): AMMONIA in the last 168 hours. Coagulation Profile: Recent Labs  Lab 04/04/24 2253  INR 1.0   Cardiac Enzymes: No results for input(s): CKTOTAL, CKMB, CKMBINDEX, TROPONINI in the last 168 hours. BNP (last 3 results) No results for input(s): PROBNP in the last 8760 hours. HbA1C: No results for input(s): HGBA1C in the last 72 hours. CBG: No results for input(s): GLUCAP in the last 168 hours. Lipid Profile: No results for input(s): CHOL, HDL, LDLCALC, TRIG, CHOLHDL, LDLDIRECT in the last 72 hours. Thyroid Function Tests: No results for input(s): TSH, T4TOTAL, FREET4, T3FREE, THYROIDAB in the last 72 hours. Anemia Panel: No results for input(s): VITAMINB12, FOLATE, FERRITIN, TIBC, IRON, RETICCTPCT in the last 72 hours. Urine analysis:    Component Value Date/Time   COLORURINE YELLOW (A) 03/09/2023 0612   APPEARANCEUR CLEAR (A)  03/09/2023 0612   APPEARANCEUR Clear 09/10/2020 1522   LABSPEC 1.011 03/09/2023 0612   LABSPEC 1.023 04/05/2014 1904   PHURINE 6.0 03/09/2023 0612   GLUCOSEU NEGATIVE 03/09/2023 0612   GLUCOSEU Negative 04/05/2014 1904   HGBUR NEGATIVE 03/09/2023 0612   BILIRUBINUR NEGATIVE 03/09/2023 0612   BILIRUBINUR Negative 09/10/2020 1522   BILIRUBINUR Negative 04/05/2014 1904   KETONESUR NEGATIVE 03/09/2023 0612   PROTEINUR NEGATIVE 03/09/2023 0612   NITRITE NEGATIVE 03/09/2023 0612   LEUKOCYTESUR NEGATIVE 03/09/2023 0612   LEUKOCYTESUR 1+ 04/05/2014 1904    Radiological Exams on Admission: CT Angio Chest PE W and/or Wo Contrast Result Date: 04/05/2024 CLINICAL DATA:  Prior pulmonary embolism, nonadherent to anticoagulation for the past month, notes chest pain, significant tachypnea. Patient arrives from home w/ [facility] with increasing shortness of breath  x2 days now. Hx of PE, ran out of blood thinner EXAM: CT ANGIOGRAPHY CHEST WITH CONTRAST TECHNIQUE: Multidetector CT imaging of the chest was performed using the standard protocol during bolus administration of intravenous contrast. Multiplanar CT image reconstructions and MIPs were obtained to evaluate the vascular anatomy. RADIATION DOSE REDUCTION: This exam was performed according to the departmental dose-optimization program which includes automated exposure control, adjustment of the mA and/or kV according to patient size and/or use of iterative reconstruction technique. CONTRAST:  75mL OMNIPAQUE  IOHEXOL  350 MG/ML SOLN COMPARISON:  CT angio chest 07/07/2023 FINDINGS: Cardiovascular: Satisfactory opacification of the pulmonary arteries to the segmental level. No evidence of pulmonary embolism. Normal heart size. Trace to small volume pericardial effusion. The thoracic aorta is normal in caliber. No atherosclerotic plaque of the thoracic aorta. No coronary artery calcifications. Mediastinum/Nodes: No enlarged mediastinal, hilar, or axillary lymph nodes.  Thyroid gland, trachea, and esophagus demonstrate no significant findings. Lungs/Pleura: No focal consolidation. No pulmonary nodule. No pulmonary mass. No pleural effusion. No pneumothorax. Upper Abdomen: No acute abnormality. Musculoskeletal: No chest wall abnormality. No suspicious lytic or blastic osseous lesions. No acute displaced fracture. Review of the MIP images confirms the above findings. IMPRESSION: 1. No pulmonary embolus. 2. Trace to small volume pericardial effusion. 3. No acute intrapulmonary abnormality. Electronically Signed   By: Morgane  Naveau M.D.   On: 04/05/2024 00:30   DG Chest Portable 1 View Result Date: 04/04/2024 EXAM: 1 VIEW XRAY OF THE CHEST 04/04/2024 11:03:21 PM COMPARISON: Radiograph and CT 07/07/23 CLINICAL HISTORY: Short of breath. FINDINGS: LUNGS AND PLEURA: No focal pulmonary opacity. No pulmonary edema. No pleural effusion. No pneumothorax. HEART AND MEDIASTINUM: Prominent heart size, stable from prior comparison. BONES AND SOFT TISSUES: No acute osseous abnormality. IMPRESSION: 1. No acute findings. Electronically signed by: Andrea Gasman MD 04/04/2024 11:14 PM EDT RP Workstation: [id number]   Data Reviewed for HPI: Relevant notes from primary care and specialist visits, past discharge summaries as available in EHR, including Care Everywhere. Prior diagnostic testing as pertinent to current admission diagnoses Updated medications and problem lists for reconciliation ED course, including vitals, labs, imaging, treatment and response to treatment Triage notes, nursing and pharmacy notes and ED provider's notes Notable results as noted above in HPI      Assessment and Plan: * Acute bronchitis Acute dyspnea/possible hyperreactive airway Vape use Improved with treatment in the ED BNP normal, CTA chest negative for PE DuoNebs as needed Oral steroids  Exertional chest pain Small pericardial effusion on CT chest 04/05/2024 History of pulmonary embolism s/p  thrombectomy (03/2023) CTA chest ruled out acute PE Troponin negative, suspect noncardiac Will get echo to evaluate pericardial effusion We will get sed rate and CRP to evaluate for possible viral pericarditis though EKG unrevealing Toradol  for pain   Hypertensive urgency Continue amlodipine    DVT prophylaxis: Lovenox   Consults: none  Advance Care Planning:   Code Status: Prior   Family Communication: none  Disposition Plan: Back to previous home environment  Severity of Illness: The appropriate patient status for this patient is OBSERVATION. Observation status is judged to be reasonable and necessary in order to provide the required intensity of service to ensure the patient's safety. The patient's presenting symptoms, physical exam findings, and initial radiographic and laboratory data in the context of their medical condition is felt to place them at decreased risk for further clinical deterioration. Furthermore, it is anticipated that the patient will be medically stable for discharge from the hospital within 2 midnights of  admission.   Author: Delayne LULLA Solian, MD 04/05/2024 4:46 AM  For on call review [email or web address].

## 2024-04-05 NOTE — Assessment & Plan Note (Addendum)
 Acute dyspnea/possible hyperreactive airway Vape use Improved with treatment in the ED BNP normal, CTA chest negative for PE DuoNebs as needed Oral steroids

## 2024-04-05 NOTE — Progress Notes (Signed)
*  PRELIMINARY RESULTS* Echocardiogram 2D Echocardiogram has been performed.  Andrea Huang 04/05/2024, 2:45 PM

## 2024-04-05 NOTE — ED Provider Notes (Signed)
-----------------------------------------   4:30 AM on 04/05/2024 -----------------------------------------  I took over care of this patient from Dr. Clarine.  CT chest was negative for PE or any pulmonary findings.  It did show a possible small pericardial effusion.  IMPRESSION:  1. No pulmonary embolus.  2. Trace to small volume pericardial effusion.  3. No acute intrapulmonary abnormality.   On reassessment, the patient can continue to report chest pain and some shortness of breath although it had improved from prior.  She was not requiring oxygen.  However after further observation for several hours, the patient continued to have relatively significant chest pain and shortness of breath.  We did a trial of ambulation.  The patient did not become hypoxic, but did immediately become tachycardic and reported increased chest pain and shortness of breath.  Differential includes bronchospasm or reactive airways, acute bronchitis, or symptoms possibly related to the pericardial effusion.  Given her continued symptoms and the CT findings, I recommended admission for further workup including a possible echocardiogram.  I consulted Dr. Cleatus from the hospitalist service; based on our discussion she agrees to evaluate the patient for admission.   Jacolyn Pae, MD 04/05/24 [phone]

## 2024-04-05 NOTE — Assessment & Plan Note (Signed)
 Continue amlodipine

## 2024-04-05 NOTE — Progress Notes (Signed)
  Progress Note   [name] FMW:[id number] DOB: 1986-01-16 DOA: 04/04/2024     0 DOS: the patient was seen and examined on 04/05/2024   Brief hospital course: AVE SCHARNHORST is a 38 y.o. female with medical history significant for PE s/p thrombectomy 03/2023, HTN, vape use who presented to the ED with exertional chest pain and tachypnea.  She also complained to be short of breath at nighttime, she frequently wakes up during night due to this.  Nocturnal dyspnea has been happening since she had pulmonary thrombectomy last year.  Upon arriving to hospital, she had a significant tachycardia, blood pressure was 178/124. Lab work showed a mild hypokalemia 3.4, troponin 17, BNP 37, hemoglobin 10.3. CT angiogram of the chest negative for PE, did show trace small volume pericardial effusion.   Principal Problem:   Acute bronchitis Active Problems:   Exertional chest pain   Hypertensive urgency   Vapes nicotine  containing substance   Pericardial effusion   History of pulmonary embolism   Assessment and Plan: * Acute bronchitis Acute dyspnea/possible hyperreactive airway Vape use Improved with treatment in the ED BNP normal, CTA chest negative for PE DuoNebs as needed Oral steroids Still vaping daily, but denies any hard drugs.  Vaping could have caused her dyspnea.  Hypertensive emergency. Nocturnal dyspnea. Exertional chest pain. Small pericardial effusion. Recent pulmonary emboli Patient does not have any volume overload.  Patient described short of breath at nighttime.  Also has significant hypertension.  Differential including obstructive sleep apnea, will obtain overnight oximetry.  Also check 24 hour urine to rule out pheochromocytoma. Given most recent massive PE, patient potentially can also have significant pulmonary hypertension.  Echocardiogram pending. Patient states that she finished 6 months of Eliquis  and did not continue her medicine.  The pulmonary  emboli happened after she had a surgery.  Looks like when she was properly treated for her PE. She was placed on amlodipine  for blood pressure.  Also added metoprolol .        Subjective:  Feels better today, short of breath has improved.  Physical Exam: Vitals:   04/05/24 1130 04/05/24 1200 04/05/24 1230 04/05/24 1300  BP: (!) 156/113 (!) 147/107 (!) 156/98 (!) 141/103  Pulse: 85 70 78 83  Resp: (!) 21 18 18  (!) 22  Temp:   97.8 F (36.6 C)   TempSrc:   Oral   SpO2: 100% 100% 100% 100%  Weight:      Height:       General exam: Appears calm and comfortable  Respiratory system: Clear to auscultation. Respiratory effort normal. Cardiovascular system: S1 & S2 heard, RRR. No JVD, murmurs, rubs, gallops or clicks. No pedal edema. Gastrointestinal system: Abdomen is nondistended, soft and nontender. No organomegaly or masses felt. Normal bowel sounds heard. Central nervous system: Alert and oriented. No focal neurological deficits. Extremities: Symmetric 5 x 5 power. Skin: No rashes, lesions or ulcers Psychiatry: Judgement and insight appear normal. Mood & affect appropriate.    Data Reviewed:  Reviewed CT scan results, lab results.  Family Communication: none  Disposition: Status is: Observation      Time spent: no charge minutes  Author: Murvin Mana, MD 04/05/2024 1:26 PM  For on call review [email or web address].

## 2024-04-05 NOTE — Progress Notes (Signed)
 Pt c/o pain and dark discoloration to her right foot and tips of 4th and 5th fingers on her left hand. There is some swelling to her right foot, but skin is warm and dry to touch, pulses present in all areas, no redness. Pt had PE in 2024, but has not been taking Eliquis  for past 6 months. This is not new according to pt, it has been present for weeks.  Diastolic BP slightly elevated 861/892. Mansy, MD notified. New orders placed.

## 2024-04-05 NOTE — ED Notes (Signed)
 Patient ambulatory without assistance,still feeling shortness of breath while ambulating. Spo2 at 95% on room air while walking. Dr. Jacolyn informed.

## 2024-04-05 NOTE — Progress Notes (Signed)
 Overnight Pulse Ox study initiated at this time, patient weaned to room air at 2235 with SAT 98%. Pulse Ox study being performed on room air, RN aware.

## 2024-04-05 NOTE — ED Notes (Signed)
 Pt assisted to and from bedside commode. Tolerated well. Back in stretcher at this time. Lowest position with wheels locked. Side rails up x 2. Call light within reach. Requesting medication for headache.

## 2024-04-05 NOTE — Hospital Course (Signed)
 PE s/p thrombectomy 03/2023 no longer on AC, HTN, vape use who presented to the ED with exertional chest pain and tachypnea.  She arrived by EMS who administered DuoNebs en route with some improvement. In the ED: She was afebrile, persistently tachypneic, hypertensive to 178/124, not hypoxic. Labs were unremarkable except for mild anemia of 10.3 and mild hypokalemia of 3.4. Troponin 17 and BNP 37 Lipase and LFTs normal VBG normal and respiratory viral panel negative.EKG Showed sinus tachycardia at 109 with nonspecific ST-T wave changes.  CTA chest PE protocol negative for PE but showed a trace to small volume pericardial effusion and otherwise no acute process  Patient was treated with additional DuoNebs, Dilaudid  and Toradol  for pain and given an NS bolus.  She improved with treatment but with ambulation she became tachypneic and tachycardic. Admission requested

## 2024-04-05 NOTE — Assessment & Plan Note (Addendum)
 Small pericardial effusion on CT chest 04/05/2024 History of pulmonary embolism s/p thrombectomy (03/2023) CTA chest ruled out acute PE Troponin negative, suspect noncardiac Will get echo to evaluate pericardial effusion We will get sed rate and CRP to evaluate for possible viral pericarditis though EKG unrevealing Toradol  for pain

## 2024-04-05 NOTE — ED Notes (Signed)
 Floor made aware Pt is being transported.

## 2024-04-05 NOTE — Hospital Course (Signed)
 Andrea Huang is a 38 y.o. female with medical history significant for PE s/p thrombectomy 03/2023, HTN, vape use who presented to the ED with exertional chest pain and tachypnea.  She also complained to be short of breath at nighttime, she frequently wakes up during night due to this.  Nocturnal dyspnea has been happening since she had pulmonary thrombectomy last year.  Upon arriving to hospital, she had a significant tachycardia, blood pressure was 178/124. Lab work showed a mild hypokalemia 3.4, troponin 17, BNP 37, hemoglobin 10.3. CT angiogram of the chest negative for PE, did show trace small volume pericardial effusion.

## 2024-04-05 NOTE — ED Notes (Signed)
 Apple juice provided to drink per request. Denies any other needs at this time. Stretcher in lowest position with wheels locked. Side rails up x 2. Wheels locked. Call light within reach.

## 2024-04-05 NOTE — ED Notes (Signed)
 Pt noted to have had delivered to her room.  Items not consistent w/ her ordered diet.

## 2024-04-06 ENCOUNTER — Observation Stay

## 2024-04-06 DIAGNOSIS — Z720 Tobacco use: Secondary | ICD-10-CM | POA: Diagnosis not present

## 2024-04-06 DIAGNOSIS — I160 Hypertensive urgency: Secondary | ICD-10-CM | POA: Diagnosis not present

## 2024-04-06 DIAGNOSIS — J209 Acute bronchitis, unspecified: Secondary | ICD-10-CM | POA: Diagnosis not present

## 2024-04-06 MED ORDER — PREDNISONE 20 MG PO TABS
20.0000 mg | ORAL_TABLET | Freq: Every day | ORAL | Status: DC
  Administered 2024-04-07: 20 mg via ORAL
  Filled 2024-04-06: qty 1

## 2024-04-06 MED ORDER — LIDOCAINE 5 % EX PTCH
1.0000 | MEDICATED_PATCH | CUTANEOUS | Status: DC
  Administered 2024-04-06 – 2024-04-07 (×2): 1 via TRANSDERMAL
  Filled 2024-04-06 (×2): qty 1

## 2024-04-06 NOTE — Progress Notes (Signed)
 Progress Note   [name] FMW:[id number] DOB: Nov 13, 1985 DOA: 04/04/2024     0 DOS: the patient was seen and examined on 04/06/2024   Brief hospital course: Andrea Huang is a 38 y.o. female with medical history significant for PE s/p thrombectomy 03/2023, HTN, vape use who presented to the ED with exertional chest pain and tachypnea.  She also complained to be short of breath at nighttime, she frequently wakes up during night due to this.  Nocturnal dyspnea has been happening since she had pulmonary thrombectomy last year.  Upon arriving to hospital, she had a significant tachycardia, blood pressure was 178/124. Lab work showed a mild hypokalemia 3.4, troponin 17, BNP 37, hemoglobin 10.3. CT angiogram of the chest negative for PE, did show trace small volume pericardial effusion.  Echocardiogram showed normal ejection fraction, no pericardial effusion.  No valvular disease.  Patient continued complaints of chest pain   Principal Problem:   Acute bronchitis Active Problems:   Exertional chest pain   Hypertensive urgency   Vapes nicotine  containing substance   Pericardial effusion   History of pulmonary embolism   Assessment and Plan: * Acute bronchitis Acute dyspnea/possible hyperreactive airway Vape use Improved with treatment in the ED BNP normal, CTA chest negative for PE Short of breath appeared to be due to vaping with reactive airway disease.  She started on steroids, short of breath is better.  I will decrease dose to 20 mg daily.   Hypertensive emergency. Nocturnal dyspnea. Exertional chest pain. Small pericardial effusion ruled out Recent pulmonary emboli Patient does not have any volume overload.  Patient described short of breath at nighttime.  Also has significant hypertension.  Differential including obstructive sleep apnea, will obtain overnight oximetry.  Also check 24 hour urine to rule out pheochromocytoma. Given most recent massive PE,  patient potentially can also have significant pulmonary hypertension.  However, echocardiogram showed normal right heart function. Patient states that she finished 6 months of Eliquis  and did not continue her medicine.  The pulmonary emboli happened after she had a surgery.  Looks like when she was properly treated for her PE. She was placed on amlodipine  for blood pressure.  Also added metoprolol . Blood pressure much better today. Patient is still complaining of chest pain, does not want to go home.  Patient also has some tenderness in chest wall, chest pain most likely is from her chest wall.  Added Lidoderm  patch.  Right foot pain with discoloration of the toes. Obtain duplex ultrasound to rule out arterial stenosis.     Subjective:  Patient is still complaining of chest pain.  She became very emotional when talking about discharge.  She no longer has any shortness of breath  Physical Exam: Vitals:   04/06/24 0000 04/06/24 0500 04/06/24 0734 04/06/24 0736  BP: (!) 147/98 (!) 138/97 (!) 146/106 (!) 152/112  Pulse:  80 81 82  Resp:  18 17   Temp:  98 F (36.7 C) 97.6 F (36.4 C)   TempSrc:  Oral    SpO2:  99% 100% 100%  Weight:      Height:       General exam: Appears calm and comfortable  Respiratory system: Clear to auscultation. Respiratory effort normal.  Chest wall tenderness in the mid sternum. Cardiovascular system: S1 & S2 heard, RRR. No JVD, murmurs, rubs, gallops or clicks. No pedal edema. Gastrointestinal system: Abdomen is nondistended, soft and nontender. No organomegaly or masses felt. Normal bowel sounds heard. Central nervous  system: Alert and oriented. No focal neurological deficits. Extremities: Symmetric 5 x 5 power. Skin: No rashes, lesions or ulcers Psychiatry: Judgement and insight appear normal. Mood & affect appropriate.    Data Reviewed:  Echocardiogram results reviewed.  Lab results reviewed.  Family Communication: None  Disposition: Status is:  Observation      Time spent: 35 minutes  Author: Murvin Mana, MD 04/06/2024 10:53 AM  For on call review [email or web address].

## 2024-04-07 ENCOUNTER — Other Ambulatory Visit: Payer: Self-pay

## 2024-04-07 DIAGNOSIS — Z720 Tobacco use: Secondary | ICD-10-CM | POA: Diagnosis not present

## 2024-04-07 DIAGNOSIS — J209 Acute bronchitis, unspecified: Secondary | ICD-10-CM | POA: Diagnosis not present

## 2024-04-07 DIAGNOSIS — I160 Hypertensive urgency: Secondary | ICD-10-CM | POA: Diagnosis not present

## 2024-04-07 LAB — CREATININE CLEARANCE, URINE, 24 HOUR
Collection Interval-CRCL: 24 h
Creatinine Clearance: 42 mL/min — ABNORMAL LOW (ref 75–115)
Creatinine, 24H Ur: 446 mg/d — ABNORMAL LOW (ref 600–1800)
Creatinine, Urine: 27 mg/dL
Urine Total Volume-CRCL: 1650 mL

## 2024-04-07 MED ORDER — LIDOCAINE 5 % EX PTCH
1.0000 | MEDICATED_PATCH | CUTANEOUS | 0 refills | Status: AC
  Filled 2024-04-07: qty 30, 30d supply, fill #0

## 2024-04-07 MED ORDER — GABAPENTIN 100 MG PO CAPS
100.0000 mg | ORAL_CAPSULE | Freq: Two times a day (BID) | ORAL | Status: DC
  Administered 2024-04-07: 100 mg via ORAL
  Filled 2024-04-07: qty 1

## 2024-04-07 MED ORDER — ALBUTEROL SULFATE HFA 108 (90 BASE) MCG/ACT IN AERS
2.0000 | INHALATION_SPRAY | Freq: Four times a day (QID) | RESPIRATORY_TRACT | 0 refills | Status: AC | PRN
  Filled 2024-04-07: qty 18, 25d supply, fill #0

## 2024-04-07 MED ORDER — GABAPENTIN 100 MG PO CAPS
100.0000 mg | ORAL_CAPSULE | Freq: Two times a day (BID) | ORAL | 0 refills | Status: AC
  Filled 2024-04-07: qty 60, 30d supply, fill #0

## 2024-04-07 MED ORDER — METOPROLOL TARTRATE 25 MG PO TABS
25.0000 mg | ORAL_TABLET | Freq: Two times a day (BID) | ORAL | 0 refills | Status: AC
Start: 2024-04-07 — End: ?
  Filled 2024-04-07: qty 60, 30d supply, fill #0

## 2024-04-07 NOTE — TOC CM/SW Note (Signed)
 Transition of Care [facility]) - Inpatient Brief Assessment   Patient Details  Name: Andrea Huang MRN: [id number] Date of Birth: [birth year]  Transition of Care [facility]) CM/SW Contact:    Lauraine JAYSON Carpen, LCSW Phone Number: 04/07/2024, 9:48 AM   Clinical Narrative: Patient has orders to discharge home today. Chart reviewed. No TOC needs identified. CSW signing off.  Transition of Care Asessment: Insurance and Status: Insurance coverage has been reviewed Patient has primary care physician: Yes Home environment has been reviewed: Mobile home Prior level of function:: Not documented Prior/Current Home Services: No current home services Social Drivers of Health Review: SDOH reviewed no interventions necessary Readmission risk has been reviewed: Yes Transition of care needs: no transition of care needs at this time

## 2024-04-07 NOTE — Plan of Care (Signed)

## 2024-04-07 NOTE — Discharge Summary (Signed)
 Physician Discharge Summary   [name]: [id number] DOB: 03/23/1986  Admit date:     04/04/2024  Discharge date: 04/07/24  Discharge Physician: Murvin Mana   PCP: [facility]   Recommendations at discharge:   Follow-up PCP in 1 week. Follow-up with results of 24-hour urine to rule out pheochromocytoma  Discharge Diagnoses: Principal Problem:   Acute bronchitis Active Problems:   Exertional chest pain   Hypertensive urgency   Vapes nicotine  containing substance   Pericardial effusion   History of pulmonary embolism  Resolved Problems:   * No resolved hospital problems. * Hypokalemia  Hospital Course: Andrea Huang is a 38 y.o. female with medical history significant for PE s/p thrombectomy 03/2023, HTN, vape use who presented to the ED with exertional chest pain and tachypnea.  She also complained to be short of breath at nighttime, she frequently wakes up during night due to this.  Nocturnal dyspnea has been happening since she had pulmonary thrombectomy last year.  Upon arriving to hospital, she had a significant tachycardia, blood pressure was 178/124. Lab work showed a mild hypokalemia 3.4, troponin 17, BNP 37, hemoglobin 10.3. CT angiogram of the chest negative for PE, did show trace small volume pericardial effusion.  Echocardiogram showed normal ejection fraction, no pericardial effusion.  No valvular disease.  Patient continued complaints of chest pain.  Improved today.  Medically stable for discharge.  Assessment and Plan:  * Acute bronchitis Acute dyspnea/possible hyperreactive airway Vape use Improved with treatment in the ED BNP normal, CTA chest negative for PE Short of breath appeared to be due to vaping with reactive airway disease.  She started on steroids, short of breath is better.  No need for additional steroids.  Will give as needed albuterol .  Medically stable for discharge.   Hypertensive  emergency. Nocturnal dyspnea. Exertional chest pain. Small pericardial effusion ruled out Recent pulmonary emboli Patient does not have any volume overload.  Patient described short of breath at nighttime.  Also has significant hypertension.  Differential including obstructive sleep apnea, will obtain overnight oximetry.  Also checked 24 hour urine to rule out pheochromocytoma.  Urine will finish collection today before discharge, please follow-up on the results. Given most recent massive PE, patient potentially can also have significant pulmonary hypertension.  However, echocardiogram showed normal right heart function. Patient states that she finished 6 months of Eliquis  and did not continue her medicine.  The pulmonary emboli happened after she had a surgery.  Looks like when she was properly treated for her PE. She was placed on amlodipine  for blood pressure.  Also added metoprolol . Blood pressure much better today. Chest pain also resolved today, it appears to be secondary to skeletal muscular pain.   Right foot pain with discoloration of the toes. Likely peripheral neuropathy Duplex ultrasound did not show any occlusion.  Condition most likely secondary to peripheral neuropathy.  Neurontin  prescribed.         Consultants: None Procedures performed: None  Disposition: Home Diet recommendation:  Discharge Diet Orders (From admission, onward)     Start     Ordered   04/07/24 0000  Diet - low sodium heart healthy        04/07/24 0940           Cardiac diet DISCHARGE MEDICATION: Allergies as of 04/07/2024       Reactions   Other Itching   Pt reports had allergic reaction to something she was given for pain during  birth labor, but pt reports unsure of name of medication.        Medication List     STOP taking these medications    apixaban  5 MG Tabs tablet Commonly known as: ELIQUIS    meloxicam  15 MG tablet Commonly known as: MOBIC    oxyCODONE -acetaminophen   5-325 MG tablet Commonly known as: Percocet   saccharomyces boulardii 250 MG capsule Commonly known as: FLORASTOR       TAKE these medications    acetaminophen  500 MG tablet Commonly known as: TYLENOL  Take 2 tablets (1,000 mg total) by mouth every 8 (eight) hours.   albuterol  108 (90 Base) MCG/ACT inhaler Commonly known as: VENTOLIN  HFA Inhale 2 puffs into the lungs every 6 (six) hours as needed for wheezing or shortness of breath.   amLODipine  5 MG tablet Commonly known as: NORVASC  Take 1 tablet (5 mg total) by mouth daily.   gabapentin  100 MG capsule Commonly known as: NEURONTIN  Take 1 capsule (100 mg total) by mouth 2 (two) times daily.   lidocaine  5 % Commonly known as: LIDODERM  Place 1 patch onto the skin daily. Remove & Discard patch within 12 hours or as directed by MD Start taking on: April 08, 2024   metoprolol  tartrate 25 MG tablet Commonly known as: LOPRESSOR  Take 1 tablet (25 mg total) by mouth 2 (two) times daily.        Follow-up [employer], [facility] Follow up in 1 week(s).   Contact information: [address] [place] [address] [phone]                Discharge Exam: Andrea Huang   04/04/24 2255  Weight: 54 kg   General exam: Appears calm and comfortable  Respiratory system: Clear to auscultation. Respiratory effort normal. Cardiovascular system: S1 & S2 heard, RRR. No JVD, murmurs, rubs, gallops or clicks. No pedal edema. Gastrointestinal system: Abdomen is nondistended, soft and nontender. No organomegaly or masses felt. Normal bowel sounds heard. Central nervous system: Alert and oriented. No focal neurological deficits. Extremities: Symmetric 5 x 5 power. Skin: No rashes, lesions or ulcers Psychiatry: Judgement and insight appear normal. Mood & affect appropriate.    Condition at discharge: good  The results of significant diagnostics from this hospitalization (including imaging, microbiology,  ancillary and laboratory) are listed below for reference.   Imaging Studies: US  ARTERIAL LOWER EXTREMITY DUPLEX RIGHT(NON-ABI) Result Date: 04/06/2024 CLINICAL DATA:  Right leg pain. EXAM: RIGHT LOWER EXTREMITY ARTERIAL DUPLEX SCAN TECHNIQUE: Gray-scale sonography as well as color Doppler and duplex ultrasound was performed to evaluate the lower extremity arteries including the common, superficial and profunda femoral arteries, popliteal artery and calf arteries. COMPARISON:  None Available. FINDINGS: Right lower Extremity ABI: Not obtained Inflow: Normal common femoral arterial waveforms and velocities. No evidence of inflow (aortoiliac) disease. Outflow: Normal profunda femoral, superficial femoral and popliteal arterial waveforms and velocities. No focal elevation of the PSV to suggest stenosis. Runoff: Normal posterior and anterior tibial arterial waveforms and velocities. IMPRESSION: Normal right lower extremity arterial duplex examination. Electronically Signed   By: Juliene Balder M.D.   On: 04/06/2024 16:33   ECHOCARDIOGRAM COMPLETE Result Date: 04/05/2024    ECHOCARDIOGRAM REPORT   Patient Name:   Andrea Huang Date of Exam: 04/05/2024 Medical Rec #:  [id number]           Height:       60.0 in Accession #:    [id number]  Weight:       119.0 lb Date of Birth:  24-Apr-1986          BSA:          1.497 m Patient Age:    37 years            BP:           156/98 mmHg Patient Gender: F                   HR:           78 bpm. Exam Location:  [facility] Procedure: 2D Echo, Cardiac Doppler, Color Doppler, 3D Echo and Strain Analysis            (Both Spectral and Color Flow Doppler were utilized during            procedure). Indications:     Chest pain R07.9  History:         Patient has prior history of Echocardiogram examinations, most                  recent 03/13/2023. Risk Factors:Hypertension.  Sonographer:     Christopher Furnace Referring Phys:  [id number] MURVIN MANA Diagnosing Phys: Evalene Lunger MD  Sonographer  Comments: Global longitudinal strain was attempted. IMPRESSIONS  1. Left ventricular ejection fraction, by estimation, is 60 to 65%. Left ventricular ejection fraction by PLAX is 68 %. The left ventricle has normal function. The left ventricle has no regional wall motion abnormalities. There is moderate left ventricular hypertrophy. Left ventricular diastolic parameters were normal. The average left ventricular global longitudinal strain is -11.7 %.  2. Right ventricular systolic function is normal. The right ventricular size is normal.  3. The mitral valve is normal in structure. Mild mitral valve regurgitation. No evidence of mitral stenosis.  4. The aortic valve is tricuspid. Aortic valve regurgitation is not visualized. No aortic stenosis is present.  5. The inferior vena cava is normal in size with greater than 50% respiratory variability, suggesting right atrial pressure of 3 mmHg. FINDINGS  Left Ventricle: Left ventricular ejection fraction, by estimation, is 60 to 65%. Left ventricular ejection fraction by PLAX is 68 %. The left ventricle has normal function. The left ventricle has no regional wall motion abnormalities. The average left ventricular global longitudinal strain is -11.7 %. Strain was performed and the global longitudinal strain is indeterminate. The left ventricular internal cavity size was normal in size. There is moderate left ventricular hypertrophy. Left ventricular diastolic parameters were normal. Right Ventricle: The right ventricular size is normal. No increase in right ventricular wall thickness. Right ventricular systolic function is normal. Left Atrium: Left atrial size was normal in size. Right Atrium: Right atrial size was normal in size. Pericardium: There is no evidence of pericardial effusion. Mitral Valve: The mitral valve is normal in structure. Mild mitral valve regurgitation. No evidence of mitral valve stenosis. MV peak gradient, 5.0 mmHg. The mean mitral valve gradient is  3.0 mmHg. Tricuspid Valve: The tricuspid valve is normal in structure. Tricuspid valve regurgitation is not demonstrated. No evidence of tricuspid stenosis. Aortic Valve: The aortic valve is tricuspid. Aortic valve regurgitation is not visualized. No aortic stenosis is present. Aortic valve mean gradient measures 5.0 mmHg. Aortic valve peak gradient measures 9.0 mmHg. Aortic valve area, by VTI measures 2.25 cm. Pulmonic Valve: The pulmonic valve was normal in structure. Pulmonic valve regurgitation is mild. No evidence of pulmonic stenosis. Aorta: The aortic root is normal  in size and structure. Venous: The inferior vena cava is normal in size with greater than 50% respiratory variability, suggesting right atrial pressure of 3 mmHg. IAS/Shunts: No atrial level shunt detected by color flow Doppler. Additional Comments: 3D was performed not requiring image post processing on an independent workstation and was indeterminate.  LEFT VENTRICLE PLAX 2D LV EF:         Left            Diastology                ventricular     LV e' medial:    8.38 cm/s                ejection        LV E/e' medial:  11.9                fraction by     LV e' lateral:   12.40 cm/s                PLAX is 68      LV E/e' lateral: 8.0                %. LVIDd:         4.00 cm         2D Longitudinal LVIDs:         2.50 cm         Strain LV PW:         1.40 cm         2D Strain GLS   -11.7 % LV IVS:        1.40 cm         Avg: LVOT diam:     2.00 cm LV SV:         57 LV SV Index:   38 LVOT Area:     3.14 cm  RIGHT VENTRICLE RV Basal diam:  1.70 cm RV Mid diam:    1.40 cm RV S prime:     17.80 cm/s TAPSE (M-mode): 2.3 cm LEFT ATRIUM             Index        RIGHT ATRIUM           Index LA diam:        3.60 cm 2.40 cm/m   RA Area:     12.60 cm LA Vol (A2C):   56.6 ml 37.80 ml/m  RA Volume:   26.70 ml  17.83 ml/m LA Vol (A4C):   57.9 ml 38.67 ml/m LA Biplane Vol: 60.4 ml 40.34 ml/m  AORTIC VALVE AV Area (Vmax):    2.18 cm AV Area (Vmean):    2.06 cm AV Area (VTI):     2.25 cm AV Vmax:           150.00 cm/s AV Vmean:          106.000 cm/s AV VTI:            0.255 m AV Peak Grad:      9.0 mmHg AV Mean Grad:      5.0 mmHg LVOT Vmax:         104.00 cm/s LVOT Vmean:        69.500 cm/s LVOT VTI:          0.183 m LVOT/AV VTI ratio: 0.72  AORTA Ao Root diam: 2.80 cm MITRAL VALVE  TRICUSPID VALVE MV Area (PHT): 3.60 cm    TR Peak grad:   17.5 mmHg MV Area VTI:   2.50 cm    TR Vmax:        209.00 cm/s MV Peak grad:  5.0 mmHg MV Mean grad:  3.0 mmHg    SHUNTS MV Vmax:       1.12 m/s    Systemic VTI:  0.18 m MV Vmean:      82.2 cm/s   Systemic Diam: 2.00 cm MV Decel Time: 211 msec MV E velocity: 99.60 cm/s MV A velocity: 56.10 cm/s MV E/A ratio:  1.78 Evalene Lunger MD Electronically signed by Evalene Lunger MD Signature Date/Time: 04/05/2024/7:13:28 PM    Final    CT Angio Chest PE W and/or Wo Contrast Result Date: 04/05/2024 CLINICAL DATA:  Prior pulmonary embolism, nonadherent to anticoagulation for the past month, notes chest pain, significant tachypnea. Patient arrives from home w/ [facility] with increasing shortness of breath x2 days now. Hx of PE, ran out of blood thinner EXAM: CT ANGIOGRAPHY CHEST WITH CONTRAST TECHNIQUE: Multidetector CT imaging of the chest was performed using the standard protocol during bolus administration of intravenous contrast. Multiplanar CT image reconstructions and MIPs were obtained to evaluate the vascular anatomy. RADIATION DOSE REDUCTION: This exam was performed according to the departmental dose-optimization program which includes automated exposure control, adjustment of the mA and/or kV according to patient size and/or use of iterative reconstruction technique. CONTRAST:  75mL OMNIPAQUE  IOHEXOL  350 MG/ML SOLN COMPARISON:  CT angio chest 07/07/2023 FINDINGS: Cardiovascular: Satisfactory opacification of the pulmonary arteries to the segmental level. No evidence of pulmonary embolism. Normal heart size. Trace to  small volume pericardial effusion. The thoracic aorta is normal in caliber. No atherosclerotic plaque of the thoracic aorta. No coronary artery calcifications. Mediastinum/Nodes: No enlarged mediastinal, hilar, or axillary lymph nodes. Thyroid gland, trachea, and esophagus demonstrate no significant findings. Lungs/Pleura: No focal consolidation. No pulmonary nodule. No pulmonary mass. No pleural effusion. No pneumothorax. Upper Abdomen: No acute abnormality. Musculoskeletal: No chest wall abnormality. No suspicious lytic or blastic osseous lesions. No acute displaced fracture. Review of the MIP images confirms the above findings. IMPRESSION: 1. No pulmonary embolus. 2. Trace to small volume pericardial effusion. 3. No acute intrapulmonary abnormality. Electronically Signed   By: Morgane  Naveau M.D.   On: 04/05/2024 00:30   DG Chest Portable 1 View Result Date: 04/04/2024 EXAM: 1 VIEW XRAY OF THE CHEST 04/04/2024 11:03:21 PM COMPARISON: Radiograph and CT 07/07/23 CLINICAL HISTORY: Short of breath. FINDINGS: LUNGS AND PLEURA: No focal pulmonary opacity. No pulmonary edema. No pleural effusion. No pneumothorax. HEART AND MEDIASTINUM: Prominent heart size, stable from prior comparison. BONES AND SOFT TISSUES: No acute osseous abnormality. IMPRESSION: 1. No acute findings. Electronically signed by: Andrea Gasman MD 04/04/2024 11:14 PM EDT RP Workstation: [id number]   US  Venous Img Lower Unilateral Right Result Date: 03/13/2024 CLINICAL DATA:  Right leg pain and swelling EXAM: RIGHT LOWER EXTREMITY VENOUS DOPPLER ULTRASOUND TECHNIQUE: Gray-scale sonography with graded compression, as well as color Doppler and duplex ultrasound were performed to evaluate the lower extremity deep venous systems from the level of the common femoral vein and including the common femoral, femoral, profunda femoral, popliteal and calf veins including the posterior tibial, peroneal and gastrocnemius veins when visible. The superficial  great saphenous vein was also interrogated. Spectral Doppler was utilized to evaluate flow at rest and with distal augmentation maneuvers in the common femoral, femoral and popliteal veins. COMPARISON:  None Available.  FINDINGS: Contralateral Common Femoral Vein: Respiratory phasicity is normal and symmetric with the symptomatic side. No evidence of thrombus. Normal compressibility. Common Femoral Vein: No evidence of thrombus. Normal compressibility, respiratory phasicity and response to augmentation. Saphenofemoral Junction: No evidence of thrombus. Normal compressibility and flow on color Doppler imaging. Profunda Femoral Vein: No evidence of thrombus. Normal compressibility and flow on color Doppler imaging. Femoral Vein: No evidence of thrombus. Normal compressibility, respiratory phasicity and response to augmentation. Popliteal Vein: No evidence of thrombus. Normal compressibility, respiratory phasicity and response to augmentation. Calf Veins: No evidence of thrombus. Normal compressibility and flow on color Doppler imaging. Superficial Great Saphenous Vein: No evidence of thrombus. Normal compressibility. Venous Reflux:  None. Other Findings:  None. IMPRESSION: No evidence of deep venous thrombosis. Electronically Signed   By: Oneil Devonshire M.D.   On: 03/13/2024 03:55   DG Foot Complete Right Result Date: 03/13/2024 CLINICAL DATA:  Right foot pain, no known injury, initial encounter EXAM: RIGHT FOOT COMPLETE - 3+ VIEW COMPARISON:  None Available. FINDINGS: No acute fracture or dislocation is noted. Mild soft tissue swelling is noted along the distal aspect of the foot dorsally. IMPRESSION: Soft tissue swelling without acute bony abnormality. Electronically Signed   By: Oneil Devonshire M.D.   On: 03/13/2024 03:02    Microbiology: Results for orders placed or performed during the hospital encounter of 04/04/24  Resp panel by RT-PCR (RSV, Flu A&B, Covid) Anterior Nasal Swab     Status: None   Collection  Time: 04/04/24 11:03 PM   Specimen: Anterior Nasal Swab  Result Value Ref Range Status   SARS Coronavirus 2 by RT PCR NEGATIVE NEGATIVE Final    Comment: (NOTE) SARS-CoV-2 target nucleic acids are NOT DETECTED.  The SARS-CoV-2 RNA is generally detectable in upper respiratory specimens during the acute phase of infection. The lowest concentration of SARS-CoV-2 viral copies this assay can detect is 138 copies/mL. A negative result does not preclude SARS-Cov-2 infection and should not be used as the sole basis for treatment or other patient management decisions. A negative result may occur with  improper specimen collection/handling, submission of specimen other than nasopharyngeal swab, presence of viral mutation(s) within the areas targeted by this assay, and inadequate number of viral copies(<138 copies/mL). A negative result must be combined with clinical observations, patient history, and epidemiological information. The expected result is Negative.  Fact Sheet for Patients:  BloggerCourse.com  Fact Sheet for Healthcare Providers:  SeriousBroker.it  This test is no t yet approved or cleared by the [place]  FDA and  has been authorized for detection and/or diagnosis of SARS-CoV-2 by FDA under an Emergency Use Authorization (EUA). This EUA will remain  in effect (meaning this test can be used) for the duration of the COVID-19 declaration under Section 564(b)(1) of the Act, 21 U.S.C.section 360bbb-3(b)(1), unless the authorization is terminated  or revoked sooner.       Influenza A by PCR NEGATIVE NEGATIVE Final   Influenza B by PCR NEGATIVE NEGATIVE Final    Comment: (NOTE) The Xpert Xpress SARS-CoV-2/FLU/RSV plus assay is intended as an aid in the diagnosis of influenza from Nasopharyngeal swab specimens and should not be used as a sole basis for treatment. Nasal washings and aspirates are unacceptable for Xpert Xpress  SARS-CoV-2/FLU/RSV testing.  Fact Sheet for Patients: BloggerCourse.com  Fact Sheet for Healthcare Providers: SeriousBroker.it  This test is not yet approved or cleared by the [place]  FDA and has been authorized for detection and/or diagnosis of  SARS-CoV-2 by FDA under an Emergency Use Authorization (EUA). This EUA will remain in effect (meaning this test can be used) for the duration of the COVID-19 declaration under Section 564(b)(1) of the Act, 21 U.S.C. section 360bbb-3(b)(1), unless the authorization is terminated or revoked.     Resp Syncytial Virus by PCR NEGATIVE NEGATIVE Final    Comment: (NOTE) Fact Sheet for Patients: BloggerCourse.com  Fact Sheet for Healthcare Providers: SeriousBroker.it  This test is not yet approved or cleared by the [place]  FDA and has been authorized for detection and/or diagnosis of SARS-CoV-2 by FDA under an Emergency Use Authorization (EUA). This EUA will remain in effect (meaning this test can be used) for the duration of the COVID-19 declaration under Section 564(b)(1) of the Act, 21 U.S.C. section 360bbb-3(b)(1), unless the authorization is terminated or revoked.  Performed at [facility], [address] [place], [place] [address]     Labs: CBC: Recent Labs  Lab 04/04/24 2253  WBC 6.3  NEUTROABS 3.5  HGB 10.3*  HCT 33.1*  MCV 86.2  PLT 231   Basic Metabolic Panel: Recent Labs  Lab 04/04/24 2253 04/05/24 1038  NA 138 135  K 3.4* 4.4  CL 104 108  CO2 26 21*  GLUCOSE 108* 132*  BUN 13 7  CREATININE 0.71 0.52  CALCIUM 8.8* 8.3*  MG  --  2.2   Liver Function Tests: Recent Labs  Lab 04/04/24 2253  AST 26  ALT 16  ALKPHOS 68  BILITOT <0.2  PROT 8.2*  ALBUMIN 3.4*   CBG: No results for input(s): GLUCAP in the last 168 hours.  Discharge time spent: 34 minutes.  Signed: Murvin Mana,  MD [facility] 04/07/2024

## 2024-04-12 LAB — CATECHOLAMINES,UR.,FREE,24 HR
Dopamine, Rand Ur: 91 ug/L
Dopamine, Ur, 24Hr: 150 ug/(24.h) (ref 0–510)
Epinephrine, Rand Ur: <3 ug/L
Epinephrine, U, 24Hr: <5 ug/(24.h) (ref 0–20)
Norepinephrine, Rand Ur: 17 ug/L
Norepinephrine,U,24H: 28 ug/(24.h) (ref 0–135)
Total Volume: 1650

## 2024-04-12 LAB — METANEPHRINES, URINE, 24 HOUR
Metaneph Total, Ur: 21 ug/L
Metanephrines, 24H Ur: 35 ug/(24.h) — ABNORMAL LOW (ref 36–209)
Normetanephrine, 24H Ur: 114 ug/(24.h) — ABNORMAL LOW (ref 131–612)
Normetanephrine, Ur: 69 ug/L
Total Volume: 1650

## 2024-07-05 ENCOUNTER — Emergency Department

## 2024-07-05 ENCOUNTER — Encounter: Payer: Self-pay | Admitting: Emergency Medicine

## 2024-07-05 ENCOUNTER — Emergency Department
Admission: EM | Admit: 2024-07-05 | Discharge: 2024-07-05 | Disposition: A | Discharge status: Home / Self Care | Attending: Emergency Medicine | Admitting: Emergency Medicine

## 2024-07-05 ENCOUNTER — Other Ambulatory Visit: Payer: Self-pay

## 2024-07-05 DIAGNOSIS — D649 Anemia, unspecified: Secondary | ICD-10-CM | POA: Insufficient documentation

## 2024-07-05 DIAGNOSIS — R079 Chest pain, unspecified: Secondary | ICD-10-CM | POA: Insufficient documentation

## 2024-07-05 DIAGNOSIS — M79671 Pain in right foot: Secondary | ICD-10-CM | POA: Insufficient documentation

## 2024-07-05 LAB — BASIC METABOLIC PANEL WITH GFR
Anion gap: 8 (ref 5–15)
BUN: 13 mg/dL (ref 6–20)
CO2: 24 mmol/L (ref 22–32)
Calcium: 8.9 mg/dL (ref 8.9–10.3)
Chloride: 103 mmol/L (ref 98–111)
Creatinine, Ser: 0.59 mg/dL (ref 0.44–1.00)
GFR, Estimated: >60 mL/min (ref >60)
Glucose, Bld: 123 mg/dL — ABNORMAL HIGH (ref 70–99)
Potassium: 3.5 mmol/L (ref 3.5–5.1)
Sodium: 135 mmol/L (ref 135–145)

## 2024-07-05 LAB — CBC
HCT: 32.6 % — ABNORMAL LOW (ref 36.0–46.0)
Hemoglobin: 10.5 g/dL — ABNORMAL LOW (ref 12.0–15.0)
MCH: 26.9 pg (ref 26.0–34.0)
MCHC: 32.2 g/dL (ref 30.0–36.0)
MCV: 83.6 fL (ref 80.0–100.0)
Platelets: 264 K/uL (ref 150–400)
RBC: 3.9 MIL/uL (ref 3.87–5.11)
RDW: 13.9 % (ref 11.5–15.5)
WBC: 3.9 K/uL — ABNORMAL LOW (ref 4.0–10.5)
nRBC: 0 % (ref 0.0–0.2)

## 2024-07-05 LAB — TROPONIN I (HIGH SENSITIVITY)
Troponin I (High Sensitivity): 12 ng/L (ref <18)
Troponin I (High Sensitivity): 14 ng/L (ref <18)

## 2024-07-05 MED ORDER — HYDROCODONE-ACETAMINOPHEN 5-325 MG PO TABS
1.0000 | ORAL_TABLET | Freq: Once | ORAL | Status: AC
  Administered 2024-07-05: 1 via ORAL
  Filled 2024-07-05: qty 1

## 2024-07-05 MED ORDER — IOHEXOL 350 MG/ML SOLN
75.0000 mL | Freq: Once | INTRAVENOUS | Status: AC | PRN
  Administered 2024-07-05: 75 mL via INTRAVENOUS

## 2024-07-05 NOTE — Discharge Instructions (Addendum)
 Your blood work, chest x-ray, chest CT and EKG were all reassuring.  I have placed a referral for cardiology.  Please watch for a phone call from cardiology.  If you do not hear from them in the next few days call the office to schedule.  In regards to your foot pain, you can wear the postop shoe if you find that to be more comfortable.  I recommend icing and elevating your foot at the end of each day.  Attached information for a podiatrist which is a foot specialist.  Please call to schedule a follow-up appointment with them.  You can take 650 mg of Tylenol  and 600 mg of ibuprofen  every 6 hours as needed for pain. You can use ice, heat, muscle creams and other topical pain relievers as well.  Return to the emergency department with any worsening symptoms.

## 2024-07-05 NOTE — ED Provider Notes (Signed)
 [facility] Provider Note    Event Date/Time   First MD Initiated Contact with Patient 07/05/24 1458     (approximate)   History   Chest Pain and Foot Pain   HPI  Andrea Huang is a 38 y.o. female with PMH of PE who presents for evaluation of chest pain and right foot pain.  Patient states that the chest pain radiates to her back, is worse with exertion and is worse when taking deep breaths.  She has had the pain for about a month.  States that she is unable to lie flat because it becomes difficult for her to breathe.  She is no longer taking Eliquis . She has not followed up with anyone regarding the foot pain or the PE.  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     Physical Exam   Triage Vital Signs: ED Triage Vitals [07/05/24 1418]  Encounter Vitals Group     BP (!) 150/104     Girls Systolic BP Percentile      Girls Diastolic BP Percentile      Boys Systolic BP Percentile      Boys Diastolic BP Percentile      Pulse Rate 84     Resp 17     Temp 98.6 F (37 C)     Temp Source Oral     SpO2 98 %     Weight 130 lb (59 kg)     Height 5' (1.524 m)     Head Circumference      Peak Flow      Pain Score 10     Pain Loc      Pain Education      Exclude from Growth Chart     Most recent vital signs: Vitals:   07/05/24 1418  BP: (!) 150/104  Pulse: 84  Resp: 17  Temp: 98.6 F (37 C)  SpO2: 98%   General: Awake, no distress.  CV:  Good peripheral perfusion.  RRR. Resp:  Normal effort.  CTAB. Abd:  No distention.  Other:  Right foot does appear mildly swollen, tender to palpation over the distal 2nd and 3rd metatarsals, dorsalis pedis pulse is 2+ and regular, ankle range of motion well-maintained, sensation intact, strength is 5/5, no pain with compression of the calf.   ED Results / Procedures / Treatments   Labs (all labs ordered are listed, but only abnormal  results are displayed) Labs Reviewed  BASIC METABOLIC PANEL WITH GFR - Abnormal; Notable for the following components:      Result Value   Glucose, Bld 123 (*)    All other components within normal limits  CBC - Abnormal; Notable for the following components:   WBC 3.9 (*)    Hemoglobin 10.5 (*)    HCT 32.6 (*)    All other components within normal limits  POC URINE PREG, ED  TROPONIN I (HIGH SENSITIVITY)  TROPONIN I (HIGH SENSITIVITY)     EKG  ED provider interpretation: Normal sinus rhythm with rightward axis  Vent. rate 81 BPM PR interval 164 ms QRS duration 76 ms QT/QTcB 366/425 ms P-R-T axes 71 123 6   RADIOLOGY  X-ray of the right foot, right ankle, chest x-ray and CT angio obtained, I interpreted the images as well as reviewed the radiologist report.  X-rays of the right foot and ankle did not show any fractures.  Chest x-ray was negative for acute cardiopulmonary abnormalities.  CT angio did not show pulmonary embolism or acute pulmonary abnormality.  They noted mild cardiomegaly and bibasilar atelectasis.  PROCEDURES:  Critical Care performed: No  Procedures   MEDICATIONS ORDERED IN ED: Medications  iohexol  (OMNIPAQUE ) 350 MG/ML injection 75 mL (75 mLs Intravenous Contrast Given 07/05/24 1743)  HYDROcodone -acetaminophen  (NORCO/VICODIN) 5-325 MG per tablet 1 tablet (1 tablet Oral Given 07/05/24 1908)     IMPRESSION / MDM / ASSESSMENT AND PLAN / ED COURSE  I reviewed the triage vital signs and the nursing notes.                             38 year old female presents for evaluation of chest pain and right foot pain.  Blood pressure is elevated, patient does have history of hypertension.  Vital signs stable otherwise.  Patient NAD on exam.  Differential diagnosis includes, but is not limited to, ACS, PE, pleurisy, pericarditis, pneumonia, pneumothorax, GERD, costochondritis, foot arthritis, foot fracture, plantar fasciitis, muscle strain.  Patient's  presentation is most consistent with acute complicated illness / injury requiring diagnostic workup.  Patient did have some tenderness to palpation over the top of the right foot.  X-rays were negative for fractures.  No overlying warmth or erythema, do not suspect cellulitis or DVT.  Unsure of the cause of patient's pain, suspect it may be due to a muscle strain or ligament injury.  Will give patient a postop shoe to wear inside of the tall walking boot.  Did advise her to follow-up with podiatry as they may get further imaging like an MRI given the ongoing nature of her pain.  Recommended she take Tylenol  and ibuprofen  as needed as well as use ice, heat and topical pain relievers for treatment of her foot pain.  CBC notable for anemia.  BMP unremarkable.  Troponin is negative x 2.  EKG shows normal sinus rhythm.  Chest x-ray is negative for acute abnormalities.  Since patient reported the symptoms felt the same as her previous PE I got a CT angio which was negative for PE.  Believe patient's pain may be musculoskeletal in origin.  Given the normal EKG and negative troponins low suspicion for ACS.  Chest x-ray was negative for pneumonia and pneumothorax.  Pain does not sound consistent with GERD although this is a possibility. Since she has not followed up with cardiology will give her a referral for this today. Patient was frustrated that she did not have an exact cause of her pain while in the ED today but was given reassurance that I do not believe there is an emergent source of her pain.  I emphasized the importance of following up with the specialist.  We discussed return precautions.  She voiced understanding, all questions were answered and she is stable at discharge.     FINAL CLINICAL IMPRESSION(S) / ED DIAGNOSES   Final diagnoses:  Chest pain, unspecified type  Right foot pain     Rx / DC Orders   ED Discharge Orders          Ordered    Ambulatory referral to Cardiology        Comments: If you have not heard from the Cardiology office within the next 72 hours please call [phone].   07/05/24 1850             Note:  This document was prepared using Dragon voice recognition software and  may include unintentional dictation errors.   Cleaster Tinnie [employer]OR, PA-C 07/05/24 2137    Arlander [place], MD 07/07/24 1053

## 2024-07-05 NOTE — ED Notes (Signed)
 Pt called out requesting food and drink. Apple juice and graham crackers provided.

## 2024-07-05 NOTE — ED Triage Notes (Signed)
 Patient to ED via POV for generalized chest pain and right foot pain. Seen for the foot pain previously and in boot. Pt states pain in chest radiates into back- ongoing x1 month.

## 2024-07-05 NOTE — ED Notes (Signed)
 Pt verbalizes understanding of discharge instructions. Opportunity for questioning and answers were provided. Reassessment of Vital signs refused. Pt discharged from ED.
